# Patient Record
Sex: Female | Born: 1955 | Race: White | Hispanic: No | Marital: Single | Smoking: Never smoker
Health system: Southern US, Community
[De-identification: ages and names within clinical notes are randomized; demographics above are authoritative.]

---

## 2004-12-27 ENCOUNTER — Ambulatory Visit: Payer: Self-pay | Admitting: Family Medicine

## 2006-04-30 ENCOUNTER — Ambulatory Visit: Payer: Self-pay | Admitting: Family Medicine

## 2008-03-03 ENCOUNTER — Ambulatory Visit: Payer: Self-pay | Admitting: Family Medicine

## 2008-03-15 ENCOUNTER — Ambulatory Visit: Payer: Self-pay | Admitting: Family Medicine

## 2009-08-25 ENCOUNTER — Ambulatory Visit: Payer: Self-pay | Admitting: Family Medicine

## 2010-03-17 ENCOUNTER — Ambulatory Visit: Payer: Self-pay | Admitting: Gastroenterology

## 2010-06-03 ENCOUNTER — Ambulatory Visit: Payer: Self-pay | Admitting: Internal Medicine

## 2010-08-06 HISTORY — PX: BARIATRIC SURGERY: SHX1103

## 2010-08-29 ENCOUNTER — Ambulatory Visit: Payer: Self-pay | Admitting: Family Medicine

## 2010-10-27 ENCOUNTER — Ambulatory Visit: Payer: Self-pay | Admitting: Specialist

## 2010-11-08 ENCOUNTER — Ambulatory Visit: Payer: Self-pay | Admitting: Specialist

## 2010-12-05 ENCOUNTER — Ambulatory Visit: Payer: Self-pay | Admitting: Specialist

## 2011-09-09 ENCOUNTER — Ambulatory Visit: Payer: Self-pay | Admitting: Internal Medicine

## 2011-09-22 ENCOUNTER — Ambulatory Visit: Payer: Self-pay | Admitting: Family Medicine

## 2014-03-03 ENCOUNTER — Ambulatory Visit: Payer: Self-pay | Admitting: Family Medicine

## 2014-03-13 ENCOUNTER — Ambulatory Visit: Payer: Self-pay | Admitting: Physician Assistant

## 2014-03-13 LAB — URINALYSIS, COMPLETE
Bacteria: NEGATIVE
Blood: NEGATIVE
Glucose,UR: NEGATIVE mg/dL (ref 0–75)
Leukocyte Esterase: NEGATIVE
Nitrite: NEGATIVE
Ph: 6 (ref 4.5–8.0)
Specific Gravity: >=1.03 (ref 1.003–1.030)

## 2014-05-14 ENCOUNTER — Ambulatory Visit: Payer: Self-pay | Admitting: Specialist

## 2016-07-26 ENCOUNTER — Encounter: Payer: Self-pay | Admitting: Family Medicine

## 2016-07-26 ENCOUNTER — Encounter: Payer: BLUE CROSS/BLUE SHIELD | Admitting: Family Medicine

## 2016-07-26 NOTE — Progress Notes (Signed)
.  Erroneous encounter - pt not seen.

## 2016-07-26 NOTE — Patient Instructions (Signed)
Erroneous encounter

## 2016-08-01 ENCOUNTER — Ambulatory Visit (INDEPENDENT_AMBULATORY_CARE_PROVIDER_SITE_OTHER): Payer: BLUE CROSS/BLUE SHIELD | Admitting: Family Medicine

## 2016-08-01 ENCOUNTER — Encounter: Payer: Self-pay | Admitting: Family Medicine

## 2016-08-01 VITALS — BP 130/100 | HR 64 | Ht 64.0 in | Wt 171.0 lb

## 2016-08-01 DIAGNOSIS — R928 Other abnormal and inconclusive findings on diagnostic imaging of breast: Secondary | ICD-10-CM

## 2016-08-01 NOTE — Progress Notes (Signed)
Name: Rebecca Mosley   MRN: [id number]    DOB: 06/29/1956   Date:08/01/2016       Progress Note  Subjective  Chief Complaint  Chief Complaint  Patient presents with  . breast exam    needs mammo ordered    Patient presents for further evaluation of outside mammogram.  Density noted in left breast.  Awaiting mammogran images from [facility] facility.    No problem-specific Assessment & Plan notes found for this encounter.   History reviewed. No pertinent past medical history.  Past Surgical History:  Procedure Laterality Date  . BARIATRIC SURGERY  2012    Family History  Problem Relation Age of Onset  . Cancer Mother   . Diabetes Father   . Diabetes Sister     Social History   Social History  . Marital status: Single    Spouse name: N/A  . Number of children: N/A  . Years of education: N/A   Occupational History  . Not on file.   Social History Main Topics  . Smoking status: Never Smoker  . Smokeless tobacco: Never Used  . Alcohol use No  . Drug use: No  . Sexual activity: No   Other Topics Concern  . Not on file   Social History Narrative  . No narrative on file    Allergies  Allergen Reactions  . Ibuprofen      Review of Systems  Constitutional: Negative for chills, fever, malaise/fatigue and weight loss.  HENT: Negative for ear discharge, ear pain and sore throat.   Eyes: Negative for blurred vision.  Respiratory: Negative for cough, sputum production, shortness of breath and wheezing.   Cardiovascular: Negative for chest pain, palpitations and leg swelling.  Gastrointestinal: Negative for abdominal pain, blood in stool, constipation, diarrhea, heartburn, melena and nausea.  Genitourinary: Negative for dysuria, frequency, hematuria and urgency.  Musculoskeletal: Negative for back pain, joint pain, myalgias and neck pain.  Skin: Negative for rash.  Neurological: Negative for dizziness, tingling, sensory change, focal weakness and headaches.   Endo/Heme/Allergies: Negative for environmental allergies and polydipsia. Does not bruise/bleed easily.  Psychiatric/Behavioral: Negative for depression and suicidal ideas. The patient is not nervous/anxious and does not have insomnia.      Objective  Vitals:   08/01/16 0901  BP: (!) 130/100  Pulse: 64  Weight: 171 lb (77.6 kg)  Height: 5\' 4"  (1.626 m)    Physical Exam  Constitutional: She is well-developed, well-nourished, and in no distress. No distress.  HENT:  Head: Normocephalic and atraumatic.  Right Ear: External ear normal.  Left Ear: External ear normal.  Nose: Nose normal.  Mouth/Throat: Oropharynx is clear and moist.  Eyes: Conjunctivae and EOM are normal. Pupils are equal, round, and reactive to light. Right eye exhibits no discharge. Left eye exhibits no discharge.  Neck: Normal range of motion. Neck supple. No JVD present. No thyromegaly present.  Cardiovascular: Normal rate, regular rhythm, normal heart sounds and intact distal pulses.  Exam reveals no gallop and no friction rub.   No murmur heard. Pulmonary/Chest: Effort normal and breath sounds normal. She has no wheezes. She has no rales. Right breast exhibits no inverted nipple, no mass, no nipple discharge, no skin change and no tenderness. Left breast exhibits no inverted nipple, no mass, no nipple discharge, no skin change and no tenderness. Breasts are symmetrical.  Abdominal: Soft. Bowel sounds are normal. She exhibits no mass. There is no tenderness. There is no guarding.  Musculoskeletal: Normal range of  motion. She exhibits no edema.  Lymphadenopathy:    She has no cervical adenopathy.  Neurological: She is alert.  Skin: Skin is warm and dry. She is not diaphoretic.  Psychiatric: Mood and affect normal.  Nursing note and vitals reviewed.     Assessment & Plan  Problem List Items Addressed This Visit    None    Visit Diagnoses    Abnormal mammogram    -  Primary   conflicting information/  awaiting release of images to local radiology/ then will be able to make appt.   Relevant Orders   MM Digital Diagnostic Bilat     Pt only had a breast exam- no CPE   Dr. Elizabeth Sauereanna Jones [facility]  08/01/16

## 2016-08-02 ENCOUNTER — Other Ambulatory Visit: Payer: Self-pay | Admitting: *Deleted

## 2016-08-02 ENCOUNTER — Inpatient Hospital Stay
Admission: RE | Admit: 2016-08-02 | Discharge: 2016-08-02 | Disposition: A | Discharge status: Home / Self Care | Payer: Self-pay | Source: Ambulatory Visit | Attending: *Deleted | Admitting: *Deleted

## 2016-08-02 DIAGNOSIS — Z9289 Personal history of other medical treatment: Secondary | ICD-10-CM

## 2016-08-07 ENCOUNTER — Other Ambulatory Visit: Payer: Self-pay

## 2016-08-07 DIAGNOSIS — R928 Other abnormal and inconclusive findings on diagnostic imaging of breast: Secondary | ICD-10-CM

## 2016-08-21 ENCOUNTER — Ambulatory Visit
Admission: RE | Admit: 2016-08-21 | Discharge: 2016-08-21 | Disposition: A | Discharge status: Home / Self Care | Payer: BLUE CROSS/BLUE SHIELD | Source: Ambulatory Visit | Attending: Family Medicine | Admitting: Family Medicine

## 2016-08-21 ENCOUNTER — Other Ambulatory Visit: Payer: Self-pay | Admitting: Family Medicine

## 2016-08-21 DIAGNOSIS — R922 Inconclusive mammogram: Secondary | ICD-10-CM | POA: Insufficient documentation

## 2016-08-21 DIAGNOSIS — R928 Other abnormal and inconclusive findings on diagnostic imaging of breast: Secondary | ICD-10-CM

## 2017-02-05 ENCOUNTER — Other Ambulatory Visit: Payer: Self-pay | Admitting: Family Medicine

## 2017-02-05 ENCOUNTER — Other Ambulatory Visit: Payer: Self-pay

## 2017-02-05 ENCOUNTER — Telehealth: Payer: Self-pay

## 2017-02-05 DIAGNOSIS — R928 Other abnormal and inconclusive findings on diagnostic imaging of breast: Secondary | ICD-10-CM

## 2017-02-05 NOTE — Telephone Encounter (Signed)
Pt called stating wrong order for mammo was put in and needed to be changed per breast center. Sent in order for Diag L) breast mammo and possible L) breast [place]

## 2017-03-12 ENCOUNTER — Ambulatory Visit
Admission: RE | Admit: 2017-03-12 | Discharge: 2017-03-12 | Disposition: A | Discharge status: Home / Self Care | Payer: BLUE CROSS/BLUE SHIELD | Source: Ambulatory Visit | Attending: Family Medicine | Admitting: Family Medicine

## 2017-03-12 DIAGNOSIS — R928 Other abnormal and inconclusive findings on diagnostic imaging of breast: Secondary | ICD-10-CM

## 2017-04-25 ENCOUNTER — Encounter: Payer: Self-pay | Admitting: Family Medicine

## 2017-04-25 ENCOUNTER — Ambulatory Visit (INDEPENDENT_AMBULATORY_CARE_PROVIDER_SITE_OTHER): Payer: BLUE CROSS/BLUE SHIELD | Admitting: Family Medicine

## 2017-04-25 VITALS — BP 110/80 | HR 80 | Ht 64.0 in | Wt 176.0 lb

## 2017-04-25 DIAGNOSIS — Q447 Other congenital malformations of liver: Secondary | ICD-10-CM

## 2017-04-25 DIAGNOSIS — R748 Abnormal levels of other serum enzymes: Secondary | ICD-10-CM | POA: Diagnosis not present

## 2017-04-25 DIAGNOSIS — B351 Tinea unguium: Secondary | ICD-10-CM

## 2017-04-25 DIAGNOSIS — K7689 Other specified diseases of liver: Secondary | ICD-10-CM

## 2017-04-25 MED ORDER — CICLOPIROX 8 % EX SOLN: Freq: Every day | CUTANEOUS | 0 refills | Status: DC

## 2017-04-25 NOTE — Progress Notes (Signed)
Name: Rebecca Mosley   MRN: [id number]    DOB: 11-Jun-1956   Date:04/25/2017       Progress Note  Subjective  Chief Complaint  Chief Complaint  Patient presents with  . Nail Problem    R) middle fingernail- when had acrylic nails taken off- real nail came off. Green in appearance and nail doesn't "grow to the skin"    Rash  This is a new problem. The current episode started more than 1 month ago (8 weeks). The affected locations include the right fingers. The rash is characterized by scaling (thickened/green). Associated with: acryllic. Pertinent negatives include no cough, diarrhea, fatigue, fever, joint pain, shortness of breath or sore throat. Treatments tried: topical antifungal. The treatment provided no relief.    No problem-specific Assessment & Plan notes found for this encounter.   No past medical history on file.  Past Surgical History:  Procedure Laterality Date  . BARIATRIC SURGERY  2012    Family History  Problem Relation Age of Onset  . Cancer Mother   . Diabetes Father   . Diabetes Sister     Social History   Social History  . Marital status: Single    Spouse name: N/A  . Number of children: N/A  . Years of education: N/A   Occupational History  . Not on file.   Social History Main Topics  . Smoking status: Never Smoker  . Smokeless tobacco: Never Used  . Alcohol use No  . Drug use: No  . Sexual activity: No   Other Topics Concern  . Not on file   Social History Narrative  . No narrative on file    Allergies  Allergen Reactions  . Ibuprofen     Outpatient Medications Prior to Visit  Medication Sig Dispense Refill  . Multiple Vitamin (MULTIVITAMIN) capsule Take 1 capsule by mouth daily.    . Calcium Carb-Cholecalciferol (CALCIUM 1000 + D PO) Take 1 capsule by mouth daily.     No facility-administered medications prior to visit.     Review of Systems  Constitutional: Negative for chills, fatigue, fever, malaise/fatigue and weight  loss.  HENT: Negative for ear discharge, ear pain and sore throat.   Eyes: Negative for blurred vision.  Respiratory: Negative for cough, sputum production, shortness of breath and wheezing.   Cardiovascular: Negative for chest pain, palpitations and leg swelling.  Gastrointestinal: Negative for abdominal pain, blood in stool, constipation, diarrhea, heartburn, melena and nausea.  Genitourinary: Negative for dysuria, frequency, hematuria and urgency.  Musculoskeletal: Negative for back pain, joint pain, myalgias and neck pain.  Skin: Positive for rash.  Neurological: Negative for dizziness, tingling, sensory change, focal weakness and headaches.  Endo/Heme/Allergies: Negative for environmental allergies and polydipsia. Does not bruise/bleed easily.  Psychiatric/Behavioral: Negative for depression and suicidal ideas. The patient is not nervous/anxious and does not have insomnia.      Objective  Vitals:   04/25/17 1416  BP: 110/80  Pulse: 80  Weight: 176 lb (79.8 kg)  Height:  (1.626 m)    Physical Exam  Constitutional: She is well-developed, well-nourished, and in no distress. No distress.  HENT:  Head: Normocephalic and atraumatic.  Right Ear: External ear normal.  Left Ear: External ear normal.  Nose: Nose normal.  Mouth/Throat: Oropharynx is clear and moist.  Eyes: Pupils are equal, round, and reactive to light. Conjunctivae and EOM are normal. Right eye exhibits no discharge. Left eye exhibits no discharge.  Neck: Normal range of motion. Neck  supple. No JVD present. No thyromegaly present.  Cardiovascular: Normal rate, regular rhythm, normal heart sounds and intact distal pulses.  Exam reveals no gallop and no friction rub.   No murmur heard. Pulmonary/Chest: Effort normal and breath sounds normal. She has no wheezes. She has no rales.  Abdominal: Soft. Bowel sounds are normal. She exhibits no mass. There is no tenderness. There is no guarding.  Musculoskeletal: Normal  range of motion. She exhibits no edema.  Lymphadenopathy:    She has no cervical adenopathy.  Neurological: She is alert. She has normal reflexes.  Skin: Skin is warm and dry. She is not diaphoretic.  Psychiatric: Mood and affect normal.  Nursing note and vitals reviewed.     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 ciclopirox (PENLAC) 8 % solution    Sig: Apply topically at bedtime. Apply over nail and surrounding skin. Apply daily over previous coat. After seven (7) days, may remove with alcohol and continue cycle.    Dispense:  6.6 mL    Refill:  0      Dr. Elizabeth Sauer [facility]  04/25/17

## 2017-04-26 ENCOUNTER — Other Ambulatory Visit: Payer: Self-pay

## 2017-04-26 LAB — HEPATIC FUNCTION PANEL (6)
ALT: 16 IU/L (ref 0–32)
AST: 23 IU/L (ref 0–40)
Albumin: 4.2 g/dL (ref 3.6–4.8)
Alkaline Phosphatase: 70 IU/L (ref 39–117)
Bilirubin Total: 0.4 mg/dL (ref 0.0–1.2)
Bilirubin, Direct: 0.14 mg/dL (ref 0.00–0.40)

## 2017-06-19 ENCOUNTER — Other Ambulatory Visit: Payer: Self-pay | Admitting: Obstetrics and Gynecology

## 2017-06-19 ENCOUNTER — Other Ambulatory Visit: Payer: Self-pay | Admitting: Family Medicine

## 2017-06-19 DIAGNOSIS — Z1231 Encounter for screening mammogram for malignant neoplasm of breast: Secondary | ICD-10-CM

## 2017-07-25 ENCOUNTER — Ambulatory Visit
Admission: RE | Admit: 2017-07-25 | Discharge: 2017-07-25 | Disposition: A | Discharge status: Home / Self Care | Payer: BLUE CROSS/BLUE SHIELD | Source: Ambulatory Visit | Attending: Family Medicine | Admitting: Family Medicine

## 2017-07-25 DIAGNOSIS — Z1231 Encounter for screening mammogram for malignant neoplasm of breast: Secondary | ICD-10-CM | POA: Insufficient documentation

## 2017-07-31 ENCOUNTER — Ambulatory Visit: Payer: BLUE CROSS/BLUE SHIELD

## 2018-07-08 ENCOUNTER — Other Ambulatory Visit: Payer: Self-pay | Admitting: Family Medicine

## 2018-07-08 DIAGNOSIS — Z1231 Encounter for screening mammogram for malignant neoplasm of breast: Secondary | ICD-10-CM

## 2018-08-04 ENCOUNTER — Encounter (INDEPENDENT_AMBULATORY_CARE_PROVIDER_SITE_OTHER): Payer: Self-pay

## 2018-08-04 ENCOUNTER — Ambulatory Visit
Admission: RE | Admit: 2018-08-04 | Discharge: 2018-08-04 | Disposition: A | Discharge status: Home / Self Care | Payer: BLUE CROSS/BLUE SHIELD | Source: Ambulatory Visit | Attending: Family Medicine | Admitting: Family Medicine

## 2018-08-04 DIAGNOSIS — Z1231 Encounter for screening mammogram for malignant neoplasm of breast: Secondary | ICD-10-CM | POA: Diagnosis present

## 2018-08-27 ENCOUNTER — Encounter: Payer: Self-pay | Admitting: Family Medicine

## 2018-08-27 ENCOUNTER — Ambulatory Visit (INDEPENDENT_AMBULATORY_CARE_PROVIDER_SITE_OTHER): Payer: BLUE CROSS/BLUE SHIELD | Admitting: Family Medicine

## 2018-08-27 VITALS — BP 110/80 | HR 64 | Ht 64.0 in | Wt 178.0 lb

## 2018-08-27 DIAGNOSIS — M170 Bilateral primary osteoarthritis of knee: Secondary | ICD-10-CM | POA: Diagnosis not present

## 2018-08-27 DIAGNOSIS — Z23 Encounter for immunization: Secondary | ICD-10-CM

## 2018-08-27 DIAGNOSIS — I83813 Varicose veins of bilateral lower extremities with pain: Secondary | ICD-10-CM

## 2018-08-27 NOTE — Progress Notes (Signed)
Date:  08/27/2018   Name:  Rebecca Mosley   DOB:  09-16-1955   MRN:  [id number]   Chief Complaint: Leg Pain (bialteral leg pain, but worse in the left- magnesium otc has helped ) and tdap  Leg Pain   The incident occurred more than 1 week ago (more the an 6 months). The incident occurred at the gym. There was no injury mechanism. The pain is present in the right knee and left knee. The quality of the pain is described as aching. The pain is at a severity of 6/10. The pain is moderate. The pain has been improving since onset. Pertinent negatives include no inability to bear weight, loss of motion, loss of sensation, muscle weakness, numbness or tingling. The symptoms are aggravated by movement. She has tried acetaminophen for the symptoms. The treatment provided mild relief.  Knee Pain   The incident occurred more than 1 week ago. There was no injury mechanism. Pain location: posterior aspect L>R knee. The pain is moderate. The pain has been intermittent since onset. Pertinent negatives include no inability to bear weight, loss of motion, loss of sensation, muscle weakness, numbness or tingling.    Review of Systems  Constitutional: Negative.  Negative for chills, fatigue, fever and unexpected weight change.  HENT: Negative for congestion, ear discharge, ear pain, rhinorrhea, sinus pressure, sneezing and sore throat.   Eyes: Negative for photophobia, pain, discharge, redness and itching.  Respiratory: Negative for cough, shortness of breath, wheezing and stridor.   Gastrointestinal: Negative for abdominal pain, blood in stool, constipation, diarrhea, nausea and vomiting.  Endocrine: Negative for cold intolerance, heat intolerance, polydipsia, polyphagia and polyuria.  Genitourinary: Negative for dysuria, flank pain, frequency, hematuria, menstrual problem, pelvic pain, urgency, vaginal bleeding and vaginal discharge.  Musculoskeletal: Negative for arthralgias, back pain and myalgias.    Skin: Negative for rash.  Allergic/Immunologic: Negative for environmental allergies and food allergies.  Neurological: Negative for dizziness, tingling, weakness, light-headedness, numbness and headaches.  Hematological: Negative for adenopathy. Does not bruise/bleed easily.  Psychiatric/Behavioral: Negative for dysphoric mood. The patient is not nervous/anxious.     There are no active problems to display for this patient.   Allergies  Allergen Reactions  . Ibuprofen     Past Surgical History:  Procedure Laterality Date  . BARIATRIC SURGERY  2012    Social History   Tobacco Use  . Smoking status: Never Smoker  . Smokeless tobacco: Never Used  Substance Use Topics  . Alcohol use: No  . Drug use: No     Medication list has been reviewed and updated.  Current Meds  Medication Sig  . Magnesium 500 MG CAPS Take 1 capsule by mouth daily.  . Multiple Vitamin (MULTIVITAMIN) capsule Take 1 capsule by mouth daily.    PHQ 2/9 Scores 04/25/2017  PHQ - 2 Score 0  PHQ- 9 Score 0    Physical Exam Vitals signs and nursing note reviewed.  Constitutional:      General: She is not in acute distress.    Appearance: She is not diaphoretic.  HENT:     Head: Normocephalic and atraumatic.     Right Ear: External ear normal.     Left Ear: External ear normal.     Nose: Nose normal.  Eyes:     General:        Right eye: No discharge.        Left eye: No discharge.     Conjunctiva/sclera: Conjunctivae normal.  Pupils: Pupils are equal, round, and reactive to light.  Neck:     Musculoskeletal: Normal range of motion and neck supple.     Thyroid: No thyromegaly.     Vascular: No JVD.  Cardiovascular:     Rate and Rhythm: Normal rate and regular rhythm.     Pulses:          Popliteal pulses are 2+ on the right side and 2+ on the left side.       Dorsalis pedis pulses are 2+ on the right side and 2+ on the left side.       Posterior tibial pulses are 2+ on the right side  and 2+ on the left side.     Heart sounds: Normal heart sounds, S1 normal and S2 normal. No murmur. No systolic murmur. No diastolic murmur. No friction rub. No gallop. No S3 or S4 sounds.   Pulmonary:     Effort: Pulmonary effort is normal.     Breath sounds: Normal breath sounds.  Abdominal:     General: Bowel sounds are normal.     Palpations: Abdomen is soft. There is no mass.     Tenderness: There is no abdominal tenderness. There is no guarding.  Musculoskeletal: Normal range of motion.     Right lower leg: No edema.     Left lower leg: No edema.  Lymphadenopathy:     Cervical: No cervical adenopathy.  Skin:    General: Skin is warm and dry.  Neurological:     Mental Status: She is alert.     Deep Tendon Reflexes: Reflexes are normal and symmetric.     BP 110/80   Pulse 64   Ht 5\' 4"  (1.626 m)   Wt 178 lb (80.7 kg)   BMI 30.55 kg/m   Assessment and Plan: 1. Primary osteoarthritis of both knees Patient has bilateral knee pain left greater than the right particularly when she is walking on a treadmill that has an incline.  Pain is noted to be primarily in the posterior aspect of both knees.  This is consistent with primary osteoarthritis patient is unable to take NSAIDs and therefore will take Tylenol at this time.  2. Varicose veins of both lower extremities with pain Patient also has a history of varicose veins of the lower extremities and pain is associated in the and generalized area although these are not inflamed and would like to have them evaluated by vein and vascular for possible intervention. - Ambulatory referral to Vascular Surgery  3. Need for Tdap vaccination Discussed and administered. - Tdap vaccine greater than or equal to 7yo IM

## 2018-09-01 ENCOUNTER — Ambulatory Visit (INDEPENDENT_AMBULATORY_CARE_PROVIDER_SITE_OTHER): Payer: BLUE CROSS/BLUE SHIELD | Admitting: Vascular Surgery

## 2018-09-01 ENCOUNTER — Encounter (INDEPENDENT_AMBULATORY_CARE_PROVIDER_SITE_OTHER): Payer: Self-pay | Admitting: Vascular Surgery

## 2018-09-01 VITALS — BP 146/82 | HR 12 | Resp 12 | Ht 64.0 in | Wt 179.2 lb

## 2018-09-01 DIAGNOSIS — M79604 Pain in right leg: Secondary | ICD-10-CM

## 2018-09-01 DIAGNOSIS — M79605 Pain in left leg: Secondary | ICD-10-CM | POA: Diagnosis not present

## 2018-09-01 DIAGNOSIS — I83813 Varicose veins of bilateral lower extremities with pain: Secondary | ICD-10-CM | POA: Diagnosis not present

## 2018-09-01 DIAGNOSIS — R600 Localized edema: Secondary | ICD-10-CM

## 2018-09-01 NOTE — Progress Notes (Signed)
Subjective:    Patient ID: Rebecca Mosley, female    DOB: August 03, 1956, 63 y.o.   MRN: [id number] Chief Complaint  Patient presents with  . New Patient (Initial Visit)   Presents as a new patient referred by Dr. Yetta Barre for evaluation of bilateral lower extremity edema and discomfort.  The patient notes a longstanding history of varicosities located bilateral legs.  The patient notes that these varicosities especially to the left lower extremity behind her knee have recently become more painful.  The patient also endorses a history of "swelling" to the bilateral lower extremities.  The patient notes her swelling had only worsened during the summer however now she notes it is swelling all the time.  The swelling and discomfort worsen towards the end of the day with sitting and standing for long periods of time.  The patient does note a large cluster of varicosities located behind her left knee.  The patient notes that her swelling is improved in the morning.  The patient does get bilateral foot cramping with ambulation.  The patient denies any rest pain or ulcer formation to the bilateral legs.  Patient denies any recent surgery or trauma to the bilateral extremities.  Patient denies any recurrent or recent bouts of cellulitis.  Patient denies any fever, nausea vomiting.  Review of Systems  Constitutional: Negative.   HENT: Negative.   Eyes: Negative.   Respiratory: Negative.   Cardiovascular: Positive for leg swelling.       Painful varicose veins  Gastrointestinal: Negative.   Endocrine: Negative.   Genitourinary: Negative.   Musculoskeletal: Negative.   Skin: Negative.   Allergic/Immunologic: Negative.   Neurological: Negative.   Hematological: Negative.   Psychiatric/Behavioral: Negative.       Objective:   Physical Exam Vitals signs reviewed.  Constitutional:      Appearance: Normal appearance. She is normal weight.  HENT:     Head: Normocephalic and atraumatic.     Right Ear:  External ear normal.     Left Ear: External ear normal.     Nose: Nose normal.     Mouth/Throat:     Mouth: Mucous membranes are dry.     Pharynx: Oropharynx is clear.  Eyes:     Extraocular Movements: Extraocular movements intact.     Conjunctiva/sclera: Conjunctivae normal.     Pupils: Pupils are equal, round, and reactive to light.  Neck:     Musculoskeletal: Normal range of motion.  Cardiovascular:     Rate and Rhythm: Normal rate and regular rhythm.     Comments: Hard to palpate pedal pulses due to body habitus and edema however bilateral feet are warm. Pulmonary:     Effort: Pulmonary effort is normal.     Breath sounds: Normal breath sounds.  Musculoskeletal: Normal range of motion.        General: Swelling (Mild to moderate nonpitting edema noted bilaterally) present.  Skin:    General: Skin is warm and dry.     Comments: Scattered less than 1 cm varicose veins of the bilateral legs.  There is a large greater than 1 cm cluster of varicosities noted to the back of the left knee.  Neurological:     General: No focal deficit present.     Mental Status: She is alert and oriented to person, place, and time.  Psychiatric:        Mood and Affect: Mood normal.        Behavior: Behavior normal.  Thought Content: Thought content normal.        Judgment: Judgment normal.    BP (!) 146/82 (BP Location: Right Arm, Patient Position: Sitting)   Pulse (!) 12   Resp 12   Ht 5\' 4"  (1.626 m)   Wt 179 lb 3.2 oz (81.3 kg)   BMI 30.76 kg/m   No past medical history on file.  Social History   Socioeconomic History  . Marital status: Single    Spouse name: Not on file  . Number of children: Not on file  . Years of education: Not on file  . Highest education level: Not on file  Occupational History  . Not on file  Social Needs  . Financial resource strain: Not on file  . Food insecurity:    Worry: Not on file    Inability: Not on file  . Transportation needs:     Medical: Not on file    Non-medical: Not on file  Tobacco Use  . Smoking status: Never Smoker  . Smokeless tobacco: Never Used  Substance and Sexual Activity  . Alcohol use: No  . Drug use: No  . Sexual activity: Never  Lifestyle  . Physical activity:    Days per week: Not on file    Minutes per session: Not on file  . Stress: Not on file  Relationships  . Social connections:    Talks on phone: Not on file    Gets together: Not on file    Attends religious service: Not on file    Active member of club or organization: Not on file    Attends meetings of clubs or organizations: Not on file    Relationship status: Not on file  . Intimate partner violence:    Fear of current or ex partner: Not on file    Emotionally abused: Not on file    Physically abused: Not on file    Forced sexual activity: Not on file  Other Topics Concern  . Not on file  Social History Narrative  . Not on file   Past Surgical History:  Procedure Laterality Date  . BARIATRIC SURGERY  2012   Family History  Problem Relation Age of Onset  . Cancer Mother   . Diabetes Father   . Diabetes Sister   . Breast cancer Cousin        mat and pat cousins   Allergies  Allergen Reactions  . Ibuprofen       Assessment & Plan:  Presents as a new patient referred by Dr. Yetta BarreJones for evaluation of bilateral lower extremity edema and discomfort.  The patient notes a longstanding history of varicosities located bilateral legs.  The patient notes that these varicosities especially to the left lower extremity behind her knee have recently become more painful.  The patient also endorses a history of "swelling" to the bilateral lower extremities.  The patient notes her swelling had only worsened during the summer however now she notes it is swelling all the time.  The swelling and discomfort worsen towards the end of the day with sitting and standing for long periods of time.  The patient does note a large cluster of  varicosities located behind her left knee.  The patient notes that her swelling is improved in the morning.  The patient does get bilateral foot cramping with ambulation.  The patient denies any rest pain or ulcer formation to the bilateral legs.  Patient denies any recent surgery or trauma to the  bilateral extremities.  Patient denies any recurrent or recent bouts of cellulitis.  Patient denies any fever, nausea vomiting.  1. Bilateral lower extremity edema - New The patient was encouraged to wear graduated compression stockings (20-30 mmHg) on a daily basis. The patient was instructed to begin wearing the stockings first thing in the morning and removing them in the evening. The patient was instructed specifically not to sleep in the stockings. Prescription given. In addition, behavioral modification including elevation during the day will be initiated. The patient was advised to follow up in one months after wearing her compression stockings daily with elevation. Information on compression stockings was given to the patient. The patient was instructed to call the office in the interim if any worsening edema or ulcerations to the legs, feet or toes occurs. The patient expresses their understanding.  - VAS US LOWER EXTREMITY VENOUS REFLUX; Future  2. Varicose veins of both lower extremities with pain - New As above  - VAS US LOWER EXTREMITY VENOUS REFLUX; Future  3. Lower extremity pain, bilateral - New Patient experiences cramping to the bilateral feet with ambulation Hard to palpate pedal pulses on exam I will bring the patient back and have her undergo bilateral ABI to rule out any contributing peripheral artery disease I have discussed with the patient at length the risk factors for and pathogenesis of atherosclerotic disease and encouraged a healthy diet, regular exercise regimen and blood pressure / glucose control.  The patient was encouraged to call the office in the interim if he  experiences any claudication like symptoms, rest pain or ulcers to his feet / toes.  - VAS US ABI WITH/WO TBI; Future  Current Outpatient Medications on File Prior to Visit  Medication Sig Dispense Refill  . Cyanocobalamin (B-12) 2000 MCG TABS Take 1 tablet by mouth daily.    . Magnesium 500 MG CAPS Take 1 capsule by mouth daily.    . Multiple Vitamin (MULTIVITAMIN) capsule Take 1 capsule by mouth daily.     No current facility-administered medications on file prior to visit.    There are no Patient Instructions on file for this visit. No follow-ups on file.  Oliviana Mcgahee A Josua Ferrebee, PA-C

## 2018-10-02 ENCOUNTER — Ambulatory Visit (INDEPENDENT_AMBULATORY_CARE_PROVIDER_SITE_OTHER): Payer: BLUE CROSS/BLUE SHIELD

## 2018-10-02 ENCOUNTER — Encounter (INDEPENDENT_AMBULATORY_CARE_PROVIDER_SITE_OTHER): Payer: Self-pay | Admitting: Vascular Surgery

## 2018-10-02 ENCOUNTER — Ambulatory Visit (INDEPENDENT_AMBULATORY_CARE_PROVIDER_SITE_OTHER): Payer: BLUE CROSS/BLUE SHIELD | Admitting: Vascular Surgery

## 2018-10-02 ENCOUNTER — Encounter (INDEPENDENT_AMBULATORY_CARE_PROVIDER_SITE_OTHER): Payer: BLUE CROSS/BLUE SHIELD

## 2018-10-02 VITALS — BP 145/79 | HR 62 | Resp 16 | Ht 64.0 in | Wt 180.0 lb

## 2018-10-02 DIAGNOSIS — R600 Localized edema: Secondary | ICD-10-CM

## 2018-10-02 DIAGNOSIS — M79605 Pain in left leg: Secondary | ICD-10-CM

## 2018-10-02 DIAGNOSIS — I83813 Varicose veins of bilateral lower extremities with pain: Secondary | ICD-10-CM | POA: Diagnosis not present

## 2018-10-02 DIAGNOSIS — I890 Lymphedema, not elsewhere classified: Secondary | ICD-10-CM | POA: Diagnosis not present

## 2018-10-02 DIAGNOSIS — M79604 Pain in right leg: Secondary | ICD-10-CM

## 2018-10-02 NOTE — Progress Notes (Signed)
MRN : [id number]  Rebecca Mosley is a 63 y.o. (1955-08-16) female who presents with chief complaint of  Chief Complaint  Patient presents with  . Follow-up    5month abi  .  History of Present Illness:   The patient returns to the office for followup evaluation regarding leg swelling.  The swelling has persisted and the pain associated with swelling continues. There have not been any interval development of a ulcerations or wounds.  Since the previous visit the patient has been wearing graduated compression stockings and has noted little if any improvement in the lymphedema. The patient has been using compression routinely morning until night.  The patient also states elevation during the day and exercise is being done too.  Venous duplex shows trivial superficial reflux, deep system is compressible with minimal reflux as well.  ABI's are normal bilaterally   Current Meds  Medication Sig  . Cyanocobalamin (B-12) 2000 MCG TABS Take 1 tablet by mouth daily.  . Magnesium 500 MG CAPS Take 1 capsule by mouth daily.  . Multiple Vitamin (MULTIVITAMIN) capsule Take 1 capsule by mouth daily.    No past medical history on file.  Past Surgical History:  Procedure Laterality Date  . BARIATRIC SURGERY  2012    Social History Social History   Tobacco Use  . Smoking status: Never Smoker  . Smokeless tobacco: Never Used  Substance Use Topics  . Alcohol use: No  . Drug use: No    Family History Family History  Problem Relation Age of Onset  . Cancer Mother   . Diabetes Father   . Diabetes Sister   . Breast cancer Cousin        mat and pat cousins    Allergies  Allergen Reactions  . Ibuprofen      REVIEW OF SYSTEMS (Negative unless checked)  Constitutional: [] Weight loss  [] Fever  [] Chills Cardiac: [] Chest pain   [] Chest pressure   [] Palpitations   [] Shortness of breath when laying flat   [] Shortness of breath with exertion. Vascular:  [] Pain in legs with  walking   [x] Pain in legs with standing  [] History of DVT   [] Phlebitis   [x] Swelling in legs   [] Varicose veins   [] Non-healing ulcers Pulmonary:   [] Uses home oxygen   [] Productive cough   [] Hemoptysis   [] Wheeze  [] COPD   [] Asthma Neurologic:  [] Dizziness   [] Seizures   [] History of stroke   [] History of TIA  [] Aphasia   [] Vissual changes   [] Weakness or numbness in arm   [] Weakness or numbness in leg Musculoskeletal:   [] Joint swelling   [x] Joint pain   [] Low back pain Hematologic:  [] Easy bruising  [] Easy bleeding   [] Hypercoagulable state   [] Anemic Gastrointestinal:  [] Diarrhea   [] Vomiting  [] Gastroesophageal reflux/heartburn   [] Difficulty swallowing. Genitourinary:  [] Chronic kidney disease   [] Difficult urination  [] Frequent urination   [] Blood in urine Skin:  [] Rashes   [] Ulcers  Psychological:  [] History of anxiety   []  History of major depression.  Physical Examination  Vitals:   10/02/18 0830  BP: (!) 145/79  Pulse: 62  Resp: 16  Weight: 180 lb (81.6 kg)  Height: 5\' 4"  (1.626 m)   Body mass index is 30.9 kg/m. Gen: WD/WN, NAD Head: [place]/AT, No temporalis wasting.  Ear/Nose/Throat: Hearing grossly intact, nares w/o erythema or drainage Eyes: PER, EOMI, sclera nonicteric.  Neck: Supple, no large masses.   Pulmonary:  Good air movement, no audible wheezing bilaterally,  no use of accessory muscles.  Cardiac: RRR, no JVD Vascular:  3+ edema bilateral lower extremities Vessel Right Left  Radial Palpable Palpable  PT Trace Palpable Trace Palpable  DP Trace Palpable Trace Palpable  Gastrointestinal: Non-distended. No guarding/no peritoneal signs.  Musculoskeletal: M/S 5/5 throughout.  No deformity or atrophy.  Neurologic: CN 2-12 intact. Symmetrical.  Speech is fluent. Motor exam as listed above. Psychiatric: Judgment intact, Mood & affect appropriate for pt's clinical situation. Dermatologic: mild venous  rashes no ulcers noted.  No changes consistent with  cellulitis. Lymph : No lichenification or skin changes of chronic lymphedema.  CBC No results found for: WBC, HGB, HCT, MCV, PLT  BMET No results found for: NA, K, CL, CO2, GLUCOSE, BUN, CREATININE, CALCIUM, GFRNONAA, GFRAA CrCl cannot be calculated (No successful lab value found.).  COAG No results found for: INR, PROTIME  Radiology No results found.   Assessment/Plan 1. Varicose veins of both lower extremities with pain Recommend:  The patient is complaining of varicose veins.    I have had a long discussion with the patient regarding  varicose veins and why they cause symptoms.  Patient will begin wearing graduated compression stockings on a daily basis, beginning first thing in the morning and removing them in the evening. The patient is instructed specifically not to sleep in the stockings.    The patient  will also begin using over-the-counter analgesics such as Motrin 600 mg po TID to help control the symptoms as needed.    In addition, behavioral modification including elevation during the day will be initiated, utilizing a recliner was recommended.  The patient is also instructed to continue exercising such as walking 4-5 times per week.  At this time the patient wishes to continue conservative therapy and is not interested in more invasive treatments such as laser ablation and sclerotherapy.   2. Bilateral lower extremity edema Recommend:  No surgery or intervention at this point in time.    I have reviewed my previous discussion with the patient regarding swelling and why it causes symptoms.  Patient will continue wearing graduated compression stockings class 1 (20-30 mmHg) on a daily basis. The patient will  beginning wearing the stockings first thing in the morning and removing them in the evening. The patient is instructed specifically not to sleep in the stockings.    In addition, behavioral modification including several periods of elevation of the lower  extremities during the day will be continued.  This was reviewed with the patient during the initial visit.  The patient will also continue routine exercise, especially walking on a daily basis as was discussed during the initial visit.    Despite conservative treatments including graduated compression therapy class 1 and behavioral modification including exercise and elevation the patient  has not obtained adequate control of the lymphedema.  The patient still has stage 3 lymphedema and therefore, I believe that a lymph pump should be added to improve the control of the patient's lymphedema.  Additionally, a lymph pump is warranted because it will reduce the risk of cellulitis and ulceration in the future.  Patient should follow-up in six months    3. Lymphedema Recommend:  No surgery or intervention at this point in time.    I have reviewed my previous discussion with the patient regarding swelling and why it causes symptoms.  Patient will continue wearing graduated compression stockings class 1 (20-30 mmHg) on a daily basis. The patient will  beginning wearing the stockings first thing  in the morning and removing them in the evening. The patient is instructed specifically not to sleep in the stockings.    In addition, behavioral modification including several periods of elevation of the lower extremities during the day will be continued.  This was reviewed with the patient during the initial visit.  The patient will also continue routine exercise, especially walking on a daily basis as was discussed during the initial visit.    Despite conservative treatments including graduated compression therapy class 1 and behavioral modification including exercise and elevation the patient  has not obtained adequate control of the lymphedema.  The patient still has stage 3 lymphedema and therefore, I believe that a lymph pump should be added to improve the control of the patient's lymphedema.   Additionally, a lymph pump is warranted because it will reduce the risk of cellulitis and ulceration in the future.  Patient should follow-up in six months     Levora Dredge, MD  10/02/2018 9:02 AM

## 2018-10-05 ENCOUNTER — Encounter (INDEPENDENT_AMBULATORY_CARE_PROVIDER_SITE_OTHER): Payer: Self-pay | Admitting: Vascular Surgery

## 2018-10-05 DIAGNOSIS — I890 Lymphedema, not elsewhere classified: Secondary | ICD-10-CM | POA: Insufficient documentation

## 2018-12-11 ENCOUNTER — Ambulatory Visit (INDEPENDENT_AMBULATORY_CARE_PROVIDER_SITE_OTHER): Payer: BLUE CROSS/BLUE SHIELD | Admitting: Family Medicine

## 2018-12-11 ENCOUNTER — Other Ambulatory Visit: Payer: Self-pay

## 2018-12-11 ENCOUNTER — Encounter: Payer: Self-pay | Admitting: Family Medicine

## 2018-12-11 VITALS — BP 120/78 | HR 68 | Ht 64.0 in | Wt 176.0 lb

## 2018-12-11 DIAGNOSIS — M7542 Impingement syndrome of left shoulder: Secondary | ICD-10-CM | POA: Diagnosis not present

## 2018-12-11 DIAGNOSIS — K219 Gastro-esophageal reflux disease without esophagitis: Secondary | ICD-10-CM

## 2018-12-11 MED ORDER — FAMOTIDINE 40 MG PO TABS: 40.0000 mg | ORAL_TABLET | Freq: Every day | ORAL | 2 refills | Status: DC

## 2018-12-11 NOTE — Patient Instructions (Signed)

## 2018-12-11 NOTE — Progress Notes (Signed)
Date:  12/11/2018   Name:  Rebecca Mosley   DOB:  [birth year]   MRN:  [id number]   Chief Complaint: Shoulder Pain (bilateral shoulder pain- has been hurting x couple of weeks) and Gastroesophageal Reflux (after eating and laying down- hasn't tried anything otc)  Shoulder Pain   The pain is present in the left shoulder. This is a new problem. The current episode started 1 to 4 weeks ago. The problem occurs constantly. The problem has been unchanged. The quality of the pain is described as aching. The pain is at a severity of 4/10. The pain is moderate. Pertinent negatives include no fever, inability to bear weight, itching, joint locking, joint swelling, limited range of motion, numbness, stiffness or tingling. The symptoms are aggravated by activity. She has tried acetaminophen for the symptoms. The treatment provided mild (to start) relief.  Gastroesophageal Reflux  She complains of belching and heartburn. She reports no abdominal pain, no chest pain, no choking, no coughing, no dysphagia, no early satiety, no globus sensation, no hoarse voice, no nausea, no sore throat, no stridor, no tooth decay, no water brash or no wheezing. This is a chronic problem. The current episode started more than 1 month ago. The problem occurs occasionally. The problem has been gradually improving. The heartburn is located in the substernum. The heartburn is of moderate intensity. The symptoms are aggravated by certain foods. Pertinent negatives include no anemia, fatigue, melena, muscle weakness, orthopnea or weight loss. There are no known risk factors. She has tried nothing for the symptoms.    Review of Systems  Constitutional: Negative.  Negative for chills, fatigue, fever, unexpected weight change and weight loss.  HENT: Negative for congestion, ear discharge, ear pain, hoarse voice, rhinorrhea, sinus pressure, sneezing and sore throat.   Eyes: Negative for photophobia, pain, discharge, redness and itching.   Respiratory: Negative for cough, choking, shortness of breath, wheezing and stridor.   Cardiovascular: Negative for chest pain.  Gastrointestinal: Positive for heartburn. Negative for abdominal pain, blood in stool, constipation, diarrhea, dysphagia, melena, nausea and vomiting.  Endocrine: Negative for cold intolerance, heat intolerance, polydipsia, polyphagia and polyuria.  Genitourinary: Negative for dysuria, flank pain, frequency, hematuria, menstrual problem, pelvic pain, urgency, vaginal bleeding and vaginal discharge.  Musculoskeletal: Negative for arthralgias, back pain, myalgias, muscle weakness and stiffness.  Skin: Negative for itching and rash.  Allergic/Immunologic: Negative for environmental allergies and food allergies.  Neurological: Negative for dizziness, tingling, weakness, light-headedness, numbness and headaches.  Hematological: Negative for adenopathy. Does not bruise/bleed easily.  Psychiatric/Behavioral: Negative for dysphoric mood. The patient is not nervous/anxious.     Patient Active Problem List   Diagnosis Date Noted  . Lymphedema 10/05/2018  . Bilateral lower extremity edema 09/01/2018  . 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 Lower extremity pain, bilateral 09/01/2018    Allergies  Allergen Reactions  . Ibuprofen     Past Surgical History:  Procedure Laterality Date  . BARIATRIC SURGERY  2012    Social History   Tobacco Use  . Smoking status: Never Smoker  . Smokeless tobacco: Never Used  Substance Use Topics  . Alcohol use: No  . Drug use: No     Medication list has been reviewed and updated.  Current Meds  Medication Sig  . Cyanocobalamin (B-12) 2000 MCG TABS Take 1 tablet by mouth daily.  . Magnesium 500 MG CAPS Take 1 capsule by mouth daily.  . Multiple Vitamin (MULTIVITAMIN) capsule Take  1 capsule by mouth daily.    PHQ 2/9 Scores 12/11/2018 04/25/2017  PHQ - 2 Score 0 0  PHQ- 9 Score 0 0    BP  Readings from Last 3 Encounters:  12/11/18 120/78  10/02/18 (!) 145/79  09/01/18 (!) 146/82    Physical Exam Vitals signs and nursing note reviewed.  Constitutional:      General: She is not in acute distress.    Appearance: She is not diaphoretic.  HENT:     Head: Normocephalic and atraumatic.     Right Ear: Tympanic membrane, ear canal and external ear normal.     Left Ear: Tympanic membrane, ear canal and external ear normal.     Nose: Nose normal.  Eyes:     General:        Right eye: No discharge.        Left eye: No discharge.     Conjunctiva/sclera: Conjunctivae normal.     Pupils: Pupils are equal, round, and reactive to light.  Neck:     Musculoskeletal: Normal range of motion and neck supple.     Thyroid: No thyromegaly.     Vascular: No JVD.  Cardiovascular:     Rate and Rhythm: Normal rate and regular rhythm.     Pulses: Normal pulses.     Heart sounds: Normal heart sounds. No murmur. No friction rub. No gallop.   Pulmonary:     Effort: Pulmonary effort is normal.     Breath sounds: Normal breath sounds. No wheezing or rhonchi.  Abdominal:     General: Bowel sounds are normal.     Palpations: Abdomen is soft. There is no hepatomegaly, splenomegaly or mass.     Tenderness: There is no abdominal tenderness. There is no guarding or rebound.  Musculoskeletal: Normal range of motion.     Left shoulder: She exhibits tenderness, swelling and spasm. She exhibits normal range of motion, no bony tenderness, no effusion, no crepitus, no deformity and no laceration.     Comments: Spasm trapezius/ tender supraspinatis tendon  Lymphadenopathy:     Cervical: No cervical adenopathy.  Skin:    General: Skin is warm and dry.     Coloration: Skin is not jaundiced.     Findings: No erythema.  Neurological:     Mental Status: She is alert.     Deep Tendon Reflexes: Reflexes are normal and symmetric.     Wt Readings from Last 3 Encounters:  12/11/18 176 lb (79.8 kg)   10/02/18 180 lb (81.6 kg)  09/01/18 179 lb 3.2 oz (81.3 kg)    BP 120/78   Pulse 68   Ht 5\' 4"  (1.626 m)   Wt 176 lb (79.8 kg)   BMI 30.21 kg/m   Assessment and Plan: 1. Impingement syndrome of left shoulder New onset.  Persistent.  Patient unable to take an NSAID so we have encouraged her to take Tylenol will refer to orthopedics for possibility of injection. - Ambulatory referral to Orthopedic Surgery  2. Gastroesophageal reflux disease, esophagitis presence not specified Patient has been having increased episodes of reflux particularly when she lays down at night will initiate famotidine pain 40 mg daily with next step being a possibility of a PPI. - famotidine (PEPCID) 40 MG tablet; Take 1 tablet (40 mg total) by mouth daily.  Dispense: 30 tablet; Refill: 2

## 2018-12-12 ENCOUNTER — Other Ambulatory Visit: Payer: Self-pay

## 2018-12-12 DIAGNOSIS — K219 Gastro-esophageal reflux disease without esophagitis: Secondary | ICD-10-CM

## 2018-12-12 MED ORDER — FAMOTIDINE 40 MG PO TABS: 20.0000 mg | ORAL_TABLET | Freq: Every day | ORAL | 0 refills | Status: DC

## 2018-12-15 ENCOUNTER — Telehealth: Payer: Self-pay

## 2018-12-15 NOTE — Telephone Encounter (Signed)
Pt called in stating she hasn't heard anything from prtho. I sent a message to check on that as well as told the pt to call me at 3:00 this afternoon if she still hasn't heard. Also, she had to cut down to pepcid 20 mg due to headaches. The headaches are gone, but she is still having heartburn at night. I advised her to pick up either prilosec or Nexium otc. If this doesn't work, then we will send her to GI for further workup. Pt is concerned she "might be having a heart attack." In which case, I informed her that she needs to go to the ER for further workup

## 2018-12-19 DIAGNOSIS — K589 Irritable bowel syndrome without diarrhea: Secondary | ICD-10-CM | POA: Insufficient documentation

## 2018-12-19 DIAGNOSIS — K219 Gastro-esophageal reflux disease without esophagitis: Secondary | ICD-10-CM | POA: Insufficient documentation

## 2019-01-23 ENCOUNTER — Other Ambulatory Visit: Payer: Self-pay

## 2019-01-23 ENCOUNTER — Ambulatory Visit (INDEPENDENT_AMBULATORY_CARE_PROVIDER_SITE_OTHER): Payer: BC Managed Care – PPO | Admitting: Family Medicine

## 2019-01-23 ENCOUNTER — Encounter: Payer: Self-pay | Admitting: Family Medicine

## 2019-01-23 VITALS — BP 120/70 | HR 72 | Ht 64.0 in | Wt 170.0 lb

## 2019-01-23 DIAGNOSIS — H6121 Impacted cerumen, right ear: Secondary | ICD-10-CM | POA: Diagnosis not present

## 2019-01-23 DIAGNOSIS — K219 Gastro-esophageal reflux disease without esophagitis: Secondary | ICD-10-CM

## 2019-01-23 MED ORDER — CARBAMIDE PEROXIDE 6.5 % OT SOLN: 5.0000 [drp] | Freq: Two times a day (BID) | OTIC | 0 refills | Status: AC

## 2019-01-23 MED ORDER — PANTOPRAZOLE SODIUM 40 MG PO TBEC: 40.0000 mg | DELAYED_RELEASE_TABLET | Freq: Every day | ORAL | 6 refills | Status: DC

## 2019-01-23 NOTE — Progress Notes (Signed)
Date:  01/23/2019   Name:  Janice NorrieDorothy J Salinger   DOB:  05/18/1956   MRN:  [id number]   Chief Complaint: Follow-up (b/p was elevated in ER)  Hypertension This is a new problem. The problem has been waxing and waning since onset. The problem is controlled. Pertinent negatives include no anxiety, blurred vision, chest pain, headaches, malaise/fatigue, neck pain, orthopnea, palpitations, peripheral edema, PND, shortness of breath or sweats. Past treatments include lifestyle changes. The current treatment provides moderate improvement. There are no compliance problems.  There is no history of angina, kidney disease, CAD/MI, CVA, heart failure, left ventricular hypertrophy, PVD or retinopathy. There is no history of chronic renal disease, a hypertension causing med or renovascular disease.  Gastroesophageal Reflux She complains of heartburn. She reports no abdominal pain, no belching, no chest pain, no choking, no coughing, no dysphagia, no early satiety, no globus sensation, no hoarse voice, no nausea, no sore throat, no stridor, no tooth decay, no water brash or no wheezing. This is a recurrent problem. The problem has been waxing and waning. The symptoms are aggravated by certain foods. Pertinent negatives include no anemia, fatigue, muscle weakness, orthopnea or weight loss. She has tried a PPI (nexium) for the symptoms. The treatment provided moderate relief.    Review of Systems  Constitutional: Negative for chills, fatigue, fever, malaise/fatigue and weight loss.  HENT: Negative for drooling, ear discharge, ear pain, hoarse voice and sore throat.   Eyes: Negative for blurred vision.  Respiratory: Negative for cough, choking, shortness of breath and wheezing.   Cardiovascular: Negative for chest pain, palpitations, orthopnea, leg swelling and PND.  Gastrointestinal: Positive for heartburn. Negative for abdominal pain, blood in stool, constipation, diarrhea, dysphagia and nausea.  Endocrine:  Negative for polydipsia.  Genitourinary: Negative for dysuria, frequency, hematuria and urgency.  Musculoskeletal: Negative for back pain, myalgias, muscle weakness and neck pain.  Skin: Negative for rash.  Allergic/Immunologic: Negative for environmental allergies.  Neurological: Negative for dizziness and headaches.  Hematological: Does not bruise/bleed easily.  Psychiatric/Behavioral: Negative for suicidal ideas. The patient is not nervous/anxious.     Patient Active Problem List   Diagnosis Date Noted  . Lymphedema 10/05/2018  . Bilateral lower extremity edema 09/01/2018  . 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 Lower extremity pain, bilateral 09/01/2018    Allergies  Allergen Reactions  . Ibuprofen     Past Surgical History:  Procedure Laterality Date  . BARIATRIC SURGERY  2012    Social History   Tobacco Use  . Smoking status: Never Smoker  . Smokeless tobacco: Never Used  Substance Use Topics  . Alcohol use: No  . Drug use: No     Medication list has been reviewed and updated.  Current Meds  Medication Sig  . Cyanocobalamin (B-12) 2000 MCG TABS Take 1 tablet by mouth daily.  . famotidine (PEPCID) 40 MG tablet Take 0.5 tablets (20 mg total) by mouth daily.  . Magnesium 500 MG CAPS Take 1 capsule by mouth daily.  . Multiple Vitamin (MULTIVITAMIN) capsule Take 1 capsule by mouth daily.    PHQ 2/9 Scores 12/11/2018 04/25/2017  PHQ - 2 Score 0 0  PHQ- 9 Score 0 0    BP Readings from Last 3 Encounters:  01/23/19 120/70  12/11/18 120/78  10/02/18 (!) 145/79    Physical Exam Vitals signs and nursing note reviewed.  Constitutional:      Appearance: She is well-developed.  HENT:  Head: Normocephalic.     Right Ear: External ear normal. There is impacted cerumen.     Left Ear: Tympanic membrane, ear canal and external ear normal.     Nose: Nose normal. No congestion.     Mouth/Throat:     Mouth: Mucous membranes are moist.      Pharynx: Oropharynx is clear.  Eyes:     General: Lids are everted, no foreign bodies appreciated. No scleral icterus.       Left eye: No foreign body or hordeolum.     Conjunctiva/sclera: Conjunctivae normal.     Right eye: Right conjunctiva is not injected.     Left eye: Left conjunctiva is not injected.     Pupils: Pupils are equal, round, and reactive to light.  Neck:     Musculoskeletal: Normal range of motion and neck supple.     Thyroid: No thyromegaly.     Vascular: No JVD.     Trachea: No tracheal deviation.  Cardiovascular:     Rate and Rhythm: Normal rate and regular rhythm.     Chest Wall: PMI is not displaced. No thrill.     Pulses: Normal pulses.     Heart sounds: Normal heart sounds, S1 normal and S2 normal. No murmur. No systolic murmur. No diastolic murmur. No friction rub. No gallop. No S3 or S4 sounds.   Pulmonary:     Effort: Pulmonary effort is normal. No respiratory distress.     Breath sounds: Normal breath sounds. No wheezing or rales.  Abdominal:     General: Abdomen is flat. Bowel sounds are normal. There is no distension.     Palpations: Abdomen is soft. There is no mass.     Tenderness: There is no abdominal tenderness. There is no guarding or rebound.     Hernia: No hernia is present.  Musculoskeletal: Normal range of motion.        General: No tenderness.  Lymphadenopathy:     Cervical: No cervical adenopathy.  Skin:    General: Skin is warm.     Findings: No rash.  Neurological:     Mental Status: She is alert and oriented to person, place, and time.     Cranial Nerves: No cranial nerve deficit.     Deep Tendon Reflexes: Reflexes normal.  Psychiatric:        Mood and Affect: Mood is not anxious or depressed.     Wt Readings from Last 3 Encounters:  01/23/19 170 lb (77.1 kg)  12/11/18 176 lb (79.8 kg)  10/02/18 180 lb (81.6 kg)    BP 120/70   Pulse 72   Ht 5\' 4"  (1.626 m)   Wt 170 lb (77.1 kg)   BMI 29.18 kg/m   Assessment and  Plan: 1. Gastroesophageal reflux disease, esophagitis presence not specified Recurrent.  Controlled on PPI/Nexium.  Patient has discomfort in the epigastric area which not sufficiently controlled on Pepcid.  Patient is controlled on over-the-counter Nexium but we are going to investigate if pantoprazole by prescription 40 mg financially more viable as well as effective. - pantoprazole (PROTONIX) 40 MG tablet; Take 1 tablet (40 mg total) by mouth daily.  Dispense: 30 tablet; Refill: 6  2. Impacted cerumen of right ear Right temple panic membrane is occluded by cerumen that is impacted in the canal.  We will initiate Debrox followed with use of a bulb syringe for removal by patient.  Patient may return to clinic if seemingly still unresolved. - carbamide peroxide (  DEBROX) 6.5 % OTIC solution; Place 5 drops into the right ear 2 (two) times daily.  Dispense: 15 mL; Refill: 0

## 2019-04-02 ENCOUNTER — Other Ambulatory Visit: Payer: Self-pay

## 2019-04-02 ENCOUNTER — Encounter (INDEPENDENT_AMBULATORY_CARE_PROVIDER_SITE_OTHER): Payer: Self-pay

## 2019-04-02 ENCOUNTER — Encounter (INDEPENDENT_AMBULATORY_CARE_PROVIDER_SITE_OTHER): Payer: Self-pay | Admitting: Vascular Surgery

## 2019-04-02 ENCOUNTER — Ambulatory Visit (INDEPENDENT_AMBULATORY_CARE_PROVIDER_SITE_OTHER): Payer: BLUE CROSS/BLUE SHIELD | Admitting: Vascular Surgery

## 2019-04-02 VITALS — BP 121/79 | HR 61 | Resp 16 | Ht 64.0 in | Wt 166.0 lb

## 2019-04-02 DIAGNOSIS — I890 Lymphedema, not elsewhere classified: Secondary | ICD-10-CM

## 2019-04-02 DIAGNOSIS — K219 Gastro-esophageal reflux disease without esophagitis: Secondary | ICD-10-CM

## 2019-04-02 DIAGNOSIS — I872 Venous insufficiency (chronic) (peripheral): Secondary | ICD-10-CM

## 2019-04-02 DIAGNOSIS — R1011 Right upper quadrant pain: Secondary | ICD-10-CM | POA: Insufficient documentation

## 2019-04-02 NOTE — Progress Notes (Signed)
MRN : [id number]  Rebecca Mosley is a 63 y.o. (June 02, 1956) female who presents with chief complaint of  Chief Complaint  Patient presents with  . Follow-up    15month follow up  .  History of Present Illness:   The patient returns to the office for followup evaluation regarding leg swelling.  The swelling has persisted and the pain associated with swelling continues. There have not been any interval development of a ulcerations or wounds.  Since the previous visit the patient has been wearing graduated compression stockings and has noted little if any improvement in the lymphedema. The patient has been using compression routinely morning until night.  The patient also states elevation during the day and exercise is being done too.  Venous duplex shows trivial superficial reflux, deep system is compressible with minimal reflux as well.  ABI's are normal bilaterally  Current Meds  Medication Sig  . Cyanocobalamin (B-12) 2000 MCG TABS Take 1 tablet by mouth daily.  . Magnesium 500 MG CAPS Take 1 capsule by mouth daily.  . Multiple Vitamin (MULTIVITAMIN) capsule Take 1 capsule by mouth daily.    No past medical history on file.  Past Surgical History:  Procedure Laterality Date  . BARIATRIC SURGERY  2012    Social History Social History   Tobacco Use  . Smoking status: Never Smoker  . Smokeless tobacco: Never Used  Substance Use Topics  . Alcohol use: No  . Drug use: No    Family History Family History  Problem Relation Age of Onset  . Cancer Mother   . Diabetes Father   . Diabetes Sister   . Breast cancer Cousin        mat and pat cousins    Allergies  Allergen Reactions  . Ibuprofen      REVIEW OF SYSTEMS (Negative unless checked)  Constitutional: [] Weight loss  [] Fever  [] Chills Cardiac: [] Chest pain   [] Chest pressure   [] Palpitations   [] Shortness of breath when laying flat   [] Shortness of breath with exertion. Vascular:  [] Pain in legs  with walking   [] Pain in legs at rest  [] History of DVT   [] Phlebitis   [x] Swelling in legs   [] Varicose veins   [] Non-healing ulcers Pulmonary:   [] Uses home oxygen   [] Productive cough   [] Hemoptysis   [] Wheeze  [] COPD   [] Asthma Neurologic:  [] Dizziness   [] Seizures   [] History of stroke   [] History of TIA  [] Aphasia   [] Vissual changes   [] Weakness or numbness in arm   [] Weakness or numbness in leg Musculoskeletal:   [] Joint swelling   [] Joint pain   [] Low back pain Hematologic:  [] Easy bruising  [] Easy bleeding   [] Hypercoagulable state   [] Anemic Gastrointestinal:  [] Diarrhea   [] Vomiting  [x] Gastroesophageal reflux/heartburn   [] Difficulty swallowing. Genitourinary:  [] Chronic kidney disease   [] Difficult urination  [] Frequent urination   [] Blood in urine Skin:  [] Rashes   [] Ulcers  Psychological:  [] History of anxiety   []  History of major depression.  Physical Examination  Vitals:   04/02/19 0841  BP: 121/79  Pulse: 61  Resp: 16  Weight: 166 lb (75.3 kg)  Height: 5\' 4"  (1.626 m)   Body mass index is 28.49 kg/m. Gen: WD/WN, NAD Head: [place]/AT, No temporalis wasting.  Ear/Nose/Throat: Hearing grossly intact, nares w/o erythema or drainage Eyes: PER, EOMI, sclera nonicteric.  Neck: Supple, no large masses.   Pulmonary:  Good air movement, no audible wheezing bilaterally, no  use of accessory muscles.  Cardiac: RRR, no JVD Vascular: scattered varicosities present bilaterally.  Mild venous stasis changes to the legs bilaterally.  2+ soft pitting edema Vessel Right Left  Radial Palpable Palpable  PT Palpable Palpable  DP Palpable Palpable  Gastrointestinal: Non-distended. No guarding/no peritoneal signs.  Musculoskeletal: M/S 5/5 throughout.  No deformity or atrophy.  Neurologic: CN 2-12 intact. Symmetrical.  Speech is fluent. Motor exam as listed above. Psychiatric: Judgment intact, Mood & affect appropriate for pt's clinical situation. Dermatologic: mild venous rashes no ulcers  noted.  No changes consistent with cellulitis. Lymph : No lichenification or skin changes of chronic lymphedema.  CBC No results found for: WBC, HGB, HCT, MCV, PLT  BMET No results found for: NA, K, CL, CO2, GLUCOSE, BUN, CREATININE, CALCIUM, GFRNONAA, GFRAA CrCl cannot be calculated (No successful lab value found.).  COAG No results found for: INR, PROTIME  Radiology No results found.   Assessment/Plan 1. Lymphedema Recommend:  No surgery or intervention at this point in time.    I have reviewed my previous discussion with the patient regarding swelling and why it causes symptoms.  Patient will continue wearing graduated compression stockings class 1 (20-30 mmHg) on a daily basis. The patient will  beginning wearing the stockings first thing in the morning and removing them in the evening. The patient is instructed specifically not to sleep in the stockings.    In addition, behavioral modification including several periods of elevation of the lower extremities during the day will be continued.  This was reviewed with the patient during the initial visit.  The patient will also continue routine exercise, especially walking on a daily basis as was discussed during the initial visit.    Despite conservative treatments including graduated compression therapy class 1 and behavioral modification including exercise and elevation the patient  has not obtained adequate control of the lymphedema.  The patient still has stage 3 lymphedema and therefore, I believe that a lymph pump should be added to improve the control of the patient's lymphedema.  Additionally, a lymph pump is warranted because it will reduce the risk of cellulitis and ulceration in the future.  Patient should follow-up in 12 months   2. Chronic venous insufficiency Recommend:  No surgery or intervention at this point in time.    I have reviewed my previous discussion with the patient regarding swelling and why  it causes symptoms.  Patient will continue wearing graduated compression stockings class 1 (20-30 mmHg) on a daily basis. The patient will  beginning wearing the stockings first thing in the morning and removing them in the evening. The patient is instructed specifically not to sleep in the stockings.    In addition, behavioral modification including several periods of elevation of the lower extremities during the day will be continued.  This was reviewed with the patient during the initial visit.  The patient will also continue routine exercise, especially walking on a daily basis as was discussed during the initial visit  3. Gastroesophageal reflux disease without esophagitis Continue PPI as already ordered, this medication has been reviewed and there are no changes at this time.  Avoidence of caffeine and alcohol  Moderate elevation of the head of the bed     Levora DredgeGregory Schnier, MD  04/02/2019 8:53 AM

## 2019-04-04 ENCOUNTER — Encounter (INDEPENDENT_AMBULATORY_CARE_PROVIDER_SITE_OTHER): Payer: Self-pay | Admitting: Vascular Surgery

## 2019-04-04 DIAGNOSIS — I872 Venous insufficiency (chronic) (peripheral): Secondary | ICD-10-CM | POA: Insufficient documentation

## 2019-04-21 ENCOUNTER — Encounter: Payer: Self-pay | Admitting: Emergency Medicine

## 2019-04-21 ENCOUNTER — Other Ambulatory Visit: Payer: Self-pay

## 2019-04-21 ENCOUNTER — Ambulatory Visit
Admission: EM | Admit: 2019-04-21 | Discharge: 2019-04-21 | Disposition: A | Discharge status: Home / Self Care | Payer: BC Managed Care – PPO | Attending: Urgent Care | Admitting: Urgent Care

## 2019-04-21 DIAGNOSIS — R21 Rash and other nonspecific skin eruption: Secondary | ICD-10-CM | POA: Diagnosis not present

## 2019-04-21 MED ORDER — PREDNISONE 10 MG (21) PO TBPK: ORAL_TABLET | Freq: Every day | ORAL | 0 refills | Status: DC

## 2019-04-21 NOTE — ED Triage Notes (Signed)
Patient c/o itchy rash all over body that started a few weeks ago. Patient has not tried any OTC medications.

## 2019-04-21 NOTE — ED Provider Notes (Signed)
[place], [place]   Name: Rebecca NorrieDorothy J Bogacki DOB: 12/14/1955 MRN: [id number] CSN: [id number] PCP: Duanne LimerickJones, Deanna C, MD  Arrival date and time:  04/21/19 1704  Chief Complaint:  Rash   NOTE: Prior to seeing the patient today, I have reviewed the triage nursing documentation and vital signs. Clinical staff has updated patient's PMH/PSHx, current medication list, and drug allergies/intolerances to ensure comprehensive history available to assist in medical decision making.   History:   HPI: Rebecca Mosley is a 63 y.o. female who presents today with complaints of rash to her entire body that initially declared a few weeks ago, but has progressed over the course of the last 2 days. Patient states, "it has been a bad season for me and bugs". Rash started on the dorsal aspects of her feet and has progressively spread to her entire body.  Patient denies any new medications, foods, soaps/body washes, laundry detergents, or cosmetics. She denies a past medical history significant for environmental allergies. She has been outside walking a lot. Patient reports that she moved the grass on Sunday, which is when she noticed an acute exacerbation of her symptoms. She advises that she has never developed a skin eruption like this in the past. Rash is erythematous and pruritic. She has not appreciated any drainage associated with the rash. There is no facial involvement; no rash to periorbital, paranasal, or perioral areas. Patient denies that she is not experiencing any shortness of breath or sensation of pharyngeal/laryngeal fullness. Despite her symptoms, patient has not taken any over the counter interventions to help improve/relieve her reported symptoms at home.   History reviewed. No pertinent past medical history.  Past Surgical History:  Procedure Laterality Date  . BARIATRIC SURGERY  2012    Family History  Problem Relation Age of Onset  . Cancer Mother   . Diabetes Father   . Diabetes Sister   .  Breast cancer Cousin        mat and pat cousins    Social History   Tobacco Use  . Smoking status: Never Smoker  . Smokeless tobacco: Never Used  Substance Use Topics  . Alcohol use: No  . Drug use: No    Patient Active Problem List   Diagnosis Date Noted  . Chronic venous insufficiency 04/04/2019  . Right upper quadrant pain 04/02/2019  . Gastroesophageal reflux disease 12/19/2018  . Irritable bowel syndrome 12/19/2018  . Lymphedema 10/05/2018  . Bilateral lower extremity edema 09/01/2018  . 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 Lower extremity pain, bilateral 09/01/2018    Home Medications:    Current Meds  Medication Sig  . carbamide peroxide (DEBROX) 6.5 % OTIC solution Place 5 drops into the right ear 2 (two) times daily.  . Cyanocobalamin (B-12) 2000 MCG TABS Take 1 tablet by mouth daily.  . Magnesium 500 MG CAPS Take 1 capsule by mouth daily.  . Multiple Vitamin (MULTIVITAMIN) capsule Take 1 capsule by mouth daily.    Allergies:   Ibuprofen  Review of Systems (ROS): Review of Systems  Constitutional: Negative for chills and fever.  HENT: Negative for drooling, facial swelling and trouble swallowing.   Respiratory: Negative for cough, chest tightness, shortness of breath and wheezing.   Cardiovascular: Negative for chest pain and palpitations.  Skin: Positive for rash.  All other systems reviewed and are negative.    Vital Signs: Today's Vitals   04/21/19 1711 04/21/19 1712 04/21/19 1713 04/21/19 1729  BP:  139/87   Pulse:   69   Resp:   18   Temp:   98.2 F (36.8 C)   TempSrc:   Oral   SpO2:   100%   Weight:  163 lb (73.9 kg)    Height:  5\' 4"  (1.626 m)    PainSc: 0-No pain   0-No pain    Physical Exam: Physical Exam  Constitutional: She is oriented to person, place, and time and well-developed, well-nourished, and in no distress.  HENT:  Head: Normocephalic and atraumatic.  Mouth/Throat: Mucous membranes are  normal.  Eyes: Pupils are equal, round, and reactive to light. EOM are normal.  Neck: Normal range of motion. Neck supple. No tracheal deviation present.  Cardiovascular: Normal rate, regular rhythm, normal heart sounds and intact distal pulses. Exam reveals no gallop and no friction rub.  Pulmonary/Chest: Effort normal and breath sounds normal. No respiratory distress. She has no wheezes. She has no rales.  Neurological: She is alert and oriented to person, place, and time. Gait normal.  Skin: Skin is warm and dry. Rash noted. Rash is maculopapular (BUE/BLE, back, stomach, and buttocks; erythematous and pruritic ).  Psychiatric: Mood, memory, affect and judgment normal.  Nursing note and vitals reviewed.   Urgent Care Treatments / Results:   LABS: PLEASE NOTE: all labs that were ordered this encounter are listed, however only abnormal results are displayed. Labs Reviewed - No data to display  EKG: -None  RADIOLOGY: No results found.  PROCEDURES: Procedures  MEDICATIONS RECEIVED THIS VISIT: Medications - No data to display  PERTINENT CLINICAL COURSE NOTES/UPDATES:   Initial Impression / Assessment and Plan / Urgent Care Course:  Pertinent labs & imaging results that were available during my care of the patient were personally reviewed by me and considered in my medical decision making (see lab/imaging section of note for values and interpretations).  Rebecca Mosley is a 63 y.o. female who presents to [facility] today with complaints of Rash   Patient is well appearing overall in clinic today. She does not appear to be in any acute distress. Presenting symptoms (see HPI) and exam as documented above. Rash is diffusely distributed to virtually her entire body. Discussed etiology as likely being chiggers based on her reported history of outdoor activities when the symptom exacerbation occurred. Patient has not taken any antihistamine medications to help with her symptoms.  Encouraged patient to start oral diphenhydramine per package instructions to help with her itching. Discussed topical versus oral steroids. Due to the large surface area that patient will have to cover, coupled with her degree of symptoms, I feel that a systemic course of steroids would be of more benefits to her. Rx for taper sent in. Patient to return call to the clinic if not improving.   Discussed follow up with primary care physician in 1 week for re-evaluation. I have reviewed the follow up and strict return precautions for any new or worsening symptoms. Patient is aware of symptoms that would be deemed urgent/emergent, and would thus require further evaluation either here or in the emergency department. At the time of discharge, she verbalized understanding and consent with the discharge plan as it was reviewed with her. All questions were fielded by provider and/or clinic staff prior to patient discharge.    Final Clinical Impressions / Urgent Care Diagnoses:   Final diagnoses:  Rash    New Prescriptions:  [place] Controlled Substance Registry consulted? Not Applicable  Meds ordered this encounter  Medications  . predniSONE (STERAPRED UNI-PAK 21 TAB) 10 MG (21) TBPK tablet    Sig: Take by mouth daily. 60 mg x 1 day, 50 mg x 1 day, 40 mg x 1 day, 30 mg x 1 day, 20 mg x 1 day, 10 mg x 1 day    Dispense:  21 tablet    Refill:  0    Recommended Follow up Care:  Patient encouraged to follow up with the following provider within the specified time frame, or sooner as dictated by the severity of her symptoms. As always, she was instructed that for any urgent/emergent care needs, she should seek care either here or in the emergency department for more immediate evaluation.  Follow-up Information    Juline Patch, MD In 1 week.   Specialty: Family Medicine Why: General reassessment of symptoms if not improving Contact information: [address] [place] [address] [phone]          NOTE: This note was prepared using Dragon dictation software along with smaller phrase technology. Despite my best ability to proofread, there is the potential that transcriptional errors may still occur from this process, and are completely unintentional.    Karen Kitchens, NP 04/21/19 1737

## 2019-04-21 NOTE — Discharge Instructions (Signed)
It was very nice seeing you today in clinic. Thank you for entrusting me with your care.  ° °Please utilize the medications that we discussed. Your prescriptions have been called in to your pharmacy.  ° °Make arrangements to follow up with your regular doctor in 1 week for re-evaluation if not improving. If your symptoms/condition worsens, please seek follow up care either here or in the ER. Please remember, our [place] providers are "right here with you" when you need us.  ° °Again, it was my pleasure to take care of you today. Thank you for choosing our clinic. I hope that you start to feel better quickly.  ° °Emalia Witkop, MSN, APRN, FNP-C, CEN °Advanced Practice Provider °[facility] [place] [facility] ° °

## 2019-06-12 ENCOUNTER — Encounter: Payer: Self-pay | Admitting: Family Medicine

## 2019-06-12 ENCOUNTER — Other Ambulatory Visit: Payer: Self-pay

## 2019-06-12 ENCOUNTER — Ambulatory Visit (INDEPENDENT_AMBULATORY_CARE_PROVIDER_SITE_OTHER): Payer: BC Managed Care – PPO | Admitting: Family Medicine

## 2019-06-12 VITALS — BP 120/70 | HR 72 | Ht 64.0 in | Wt 167.0 lb

## 2019-06-12 DIAGNOSIS — M7052 Other bursitis of knee, left knee: Secondary | ICD-10-CM | POA: Diagnosis not present

## 2019-06-12 DIAGNOSIS — M1712 Unilateral primary osteoarthritis, left knee: Secondary | ICD-10-CM

## 2019-06-12 MED ORDER — PREDNISONE 10 MG PO TABS: 10.0000 mg | ORAL_TABLET | Freq: Every day | ORAL | 0 refills | Status: DC

## 2019-06-12 NOTE — Progress Notes (Signed)
Date:  06/12/2019   Name:  Rebecca Mosley   DOB:  1956/02/04   MRN:  [id number]   Chief Complaint: Knee Pain (L) knee pain- knows she has arthritis, but going up and down with wood has aggravated it)  Knee Pain  The incident occurred 2 days ago. The incident occurred at home. Injury mechanism: loading wood. The pain is present in the left knee. The pain is at a severity of 4/10. The pain is moderate. The pain has been intermittent since onset. Pertinent negatives include no inability to bear weight or loss of motion. The symptoms are aggravated by movement. She has tried acetaminophen for the symptoms. The treatment provided mild relief.    Review of Systems  Constitutional: Negative for chills and fever.  HENT: Negative for drooling, ear discharge, ear pain and sore throat.   Respiratory: Negative for cough, shortness of breath and wheezing.   Cardiovascular: Negative for chest pain, palpitations and leg swelling.  Gastrointestinal: Negative for abdominal pain, blood in stool, constipation, diarrhea and nausea.  Endocrine: Negative for polydipsia.  Genitourinary: Negative for dysuria, frequency, hematuria and urgency.  Musculoskeletal: Negative for back pain, myalgias and neck pain.  Skin: Negative for rash.  Allergic/Immunologic: Negative for environmental allergies.  Neurological: Negative for dizziness and headaches.  Hematological: Does not bruise/bleed easily.  Psychiatric/Behavioral: Negative for suicidal ideas. The patient is not nervous/anxious.     Patient Active Problem List   Diagnosis Date Noted  . Chronic venous insufficiency 04/04/2019  . Right upper quadrant pain 04/02/2019  . Gastroesophageal reflux disease 12/19/2018  . Irritable bowel syndrome 12/19/2018  . Lymphedema 10/05/2018  . Bilateral lower extremity edema 09/01/2018  . 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 Lower extremity pain, bilateral 09/01/2018    Allergies   Allergen Reactions  . Ibuprofen     Past Surgical History:  Procedure Laterality Date  . BARIATRIC SURGERY  2012    Social History   Tobacco Use  . Smoking status: Never Smoker  . Smokeless tobacco: Never Used  Substance Use Topics  . Alcohol use: No  . Drug use: No     Medication list has been reviewed and updated.  Current Meds  Medication Sig  . carbamide peroxide (DEBROX) 6.5 % OTIC solution Place 5 drops into the right ear 2 (two) times daily.  . Magnesium 500 MG CAPS Take 1 capsule by mouth daily.  . Multiple Vitamin (MULTIVITAMIN) capsule Take 1 capsule by mouth daily.  . Multiple Vitamins-Minerals (PRESERVISION AREDS 2 PO) Take 1 tablet by mouth daily.  . [DISCONTINUED] Cyanocobalamin (B-12) 2000 MCG TABS Take 1 tablet by mouth daily.    PHQ 2/9 Scores 12/11/2018 04/25/2017  PHQ - 2 Score 0 0  PHQ- 9 Score 0 0    BP Readings from Last 3 Encounters:  06/12/19 120/70  04/21/19 139/87  04/02/19 121/79    Physical Exam Vitals signs and nursing note reviewed.  Constitutional:      Appearance: She is well-developed.  HENT:     Head: Normocephalic.     Right Ear: Tympanic membrane, ear canal and external ear normal.     Left Ear: Tympanic membrane, ear canal and external ear normal.     Nose: Nose normal.  Eyes:     General: Lids are everted, no foreign bodies appreciated. No scleral icterus.       Left eye: No foreign body or hordeolum.     Conjunctiva/sclera: Conjunctivae normal.  Right eye: Right conjunctiva is not injected.     Left eye: Left conjunctiva is not injected.     Pupils: Pupils are equal, round, and reactive to light.  Neck:     Musculoskeletal: Normal range of motion and neck supple.     Thyroid: No thyromegaly.     Vascular: No JVD.     Trachea: No tracheal deviation.  Cardiovascular:     Rate and Rhythm: Normal rate and regular rhythm.     Heart sounds: Normal heart sounds. No murmur. No friction rub. No gallop.   Pulmonary:      Effort: Pulmonary effort is normal. No respiratory distress.     Breath sounds: Normal breath sounds. No wheezing, rhonchi or rales.  Abdominal:     General: Bowel sounds are normal.     Palpations: Abdomen is soft. There is no mass.     Tenderness: There is no abdominal tenderness. There is no guarding or rebound.  Musculoskeletal: Normal range of motion.     Left knee: She exhibits swelling and abnormal patellar mobility. She exhibits normal range of motion and no effusion. Tenderness found. Medial joint line tenderness noted. No lateral joint line, no MCL, no LCL and no patellar tendon tenderness noted.     Comments: Patellar crepitence/ popliteal tenderness  Lymphadenopathy:     Cervical: No cervical adenopathy.  Skin:    General: Skin is warm.     Findings: No rash.  Neurological:     Mental Status: She is alert and oriented to person, place, and time.     Cranial Nerves: No cranial nerve deficit.     Deep Tendon Reflexes: Reflexes normal.  Psychiatric:        Mood and Affect: Mood is not anxious or depressed.     Wt Readings from Last 3 Encounters:  06/12/19 167 lb (75.8 kg)  04/21/19 163 lb (73.9 kg)  04/02/19 166 lb (75.3 kg)    BP 120/70   Pulse 72   Ht 5\' 4"  (1.626 m)   Wt 167 lb (75.8 kg)   BMI 28.67 kg/m   Assessment and Plan: 1. Popliteal bursitis of left knee Acute flare.  Persistent.  Chronic problem.  Patient is unable to take any NSAIDs due to urticaria.  Will initiate prednisone 10 mg 20 mg a day for 4 days followed by 10 mg for 4 days patient has been encouraged to ice the knee 2-3 times a day on the anterior surface and to decrease activity tomorrow specifically limiting activity on food distribution .- predniSONE (DELTASONE) 10 MG tablet; Take 1 tablet (10 mg total) by mouth daily with breakfast. 2 a day for 4 days then 1 a day for four days  Dispense: 30 tablet; Refill: 0  2. Primary osteoarthritis of left knee Chronic.  Acute flare.  As noted above we  are going to initiate a prednisone taper and will refer to orthopedics for evaluation and treatment and possible injection. - predniSONE (DELTASONE) 10 MG tablet; Take 1 tablet (10 mg total) by mouth daily with breakfast. 2 a day for 4 days then 1 a day for four days  Dispense: 30 tablet; Refill: 0 - Ambulatory referral to Orthopedic Surgery

## 2019-06-12 NOTE — Patient Instructions (Signed)
What You Need to Know About Osteoarthritis Osteoarthritis is a type of arthritis that affects tissue that covers the ends of bones in joints (cartilage). Cartilage acts as a cushion between the bones and helps them move smoothly. Osteoarthritis results when cartilage in the joints gets worn down. Osteoarthritis is sometimes called "wear and tear" arthritis. Osteoarthritis can affect any joint and can make movement painful. Hips, knees, fingers, and toes are some of the joints that are most often affected by osteoarthritis. You may be more likely to develop osteoarthritis if:  You are middle-aged or older.  You are obese.  You have injured a joint or had surgery on a joint.  You have a family history of osteoarthritis. How can osteoarthritis affect me? Osteoarthritis can cause:  Pain and swelling in your joint.  Difficulty moving your joint.  A grating or scraping feeling inside the joint when you move it.  Popping or creaking sounds when you move. This condition can make it harder to do things that you need or want to do each day. Osteoarthritis in a major joint, such as your knee or hip, can make it painful to walk or exercise. If you have osteoarthritis in your hands, you might not be able to grip items, twist your hand, or control small movements of your hands and fingers (fine motor skills). Over time, osteoarthritis could cause you to be less physically active. Being less active increases your risk for other long-term (chronic) health problems, such as diabetes and heart disease. What lifestyle changes can be made? You can lessen the impact that osteoarthritis has on your daily life by:  Switching to low-impact activities that do not put repeated pressure on your joints. For example, if you usually run or jog for exercise, try swimming or riding a bike instead.  Staying active. Build up to at least 150 minutes of physical activity each week to keep your joints healthy and keep your  body strong.  Losing weight. If you are overweight or obese, losing weight can take pressure off of your joints. If you need help with weight loss, talk with your health care provider or a diet and nutrition specialist (dietitian). What other changes can be made? You can also lessen the effect of osteoarthritis by:  Using assistive devices. Sometimes a brace, wrap, splint, specialized glove, or cane can help. Talk with your health care provider or physical therapist about when and how to use these.  Working with a physical therapist who can help you find ways to do your daily activities without harming your joints. A physical therapist can also teach you exercises and stretches to strengthen the muscles that support your joints.  Treating pain and inflammation. Take over-the-counter and prescription medicines for pain and inflammation only as told by your health care provider. If directed, you may put ice on the affected joint: ? If you have a removable assistive device, remove it as told by your health care provider. ? Put ice in a plastic bag. ? Place a towel between your skin and the bag. ? Leave the ice on for 20 minutes, 2-3 times a day. If other measures do not work, you may need joint surgery, such as joint replacement. What can happen if changes are not made? Osteoarthritis is a condition that gets worse over time (a progressive condition). If you do not take steps to strengthen your body and to slow down the progress of the disease, your condition may get worse more quickly. Your joints may  stiffen and become swollen, which will make them painful and hard to move. Where to find more information You can learn more about osteoarthritis from:  The Arthritis Foundation: www.http://www.ingram.com/  [employer] of Arthritis and Musculoskeletal and Skin Diseases: www.niams.https://www.ruiz-smith.com/ Contact a health care  provider if:  You cannot do your normal activities comfortably.  Your joint does not function at all.  Your pain is interfering with your sleep.  You are gaining weight.  Your joint appears to be changing in shape, instead of just being swollen and sore. Summary  Osteoarthritis is a painful joint disease that gets worse over time.  This condition can lead to other long-term (chronic) health problems.  There are changes that you can make to slow down the progression of the disease. This information is not intended to replace advice given to you by your health care provider. Make sure you discuss any questions you have with your health care provider. Document Released: 03/13/2016 Document Revised: 07/05/2017 Document Reviewed: 03/13/2016 Elsevier Patient Education  2020 [employer].

## 2019-07-01 ENCOUNTER — Other Ambulatory Visit: Payer: Self-pay | Admitting: Family Medicine

## 2019-07-01 DIAGNOSIS — Z1231 Encounter for screening mammogram for malignant neoplasm of breast: Secondary | ICD-10-CM

## 2019-07-27 ENCOUNTER — Other Ambulatory Visit: Payer: Self-pay

## 2019-07-27 ENCOUNTER — Encounter: Payer: Self-pay | Admitting: Family Medicine

## 2019-07-27 ENCOUNTER — Ambulatory Visit (INDEPENDENT_AMBULATORY_CARE_PROVIDER_SITE_OTHER): Payer: BC Managed Care – PPO | Admitting: Family Medicine

## 2019-07-27 VITALS — BP 138/62 | HR 72 | Ht 64.0 in | Wt 166.0 lb

## 2019-07-27 DIAGNOSIS — K219 Gastro-esophageal reflux disease without esophagitis: Secondary | ICD-10-CM

## 2019-07-27 NOTE — Progress Notes (Signed)
Date:  07/27/2019   Name:  Rebecca Mosley   DOB:  Aug 25, 1955   MRN:  [id number]   Chief Complaint: Follow-up (for starting pantoprazole- no longer taking the med, the issue is resolved)  Gastroesophageal Reflux She reports no abdominal pain, no belching, no chest pain, no choking, no coughing, no dysphagia, no early satiety, no globus sensation, no heartburn, no hoarse voice, no nausea, no sore throat, no stridor, no tooth decay, no water brash or no wheezing. This is a chronic problem. The problem occurs occasionally. The problem has been waxing and waning. The symptoms are aggravated by certain foods. Pertinent negatives include no anemia, fatigue, melena, muscle weakness, orthopnea or weight loss. There are no known risk factors. She has tried a PPI (since discontinued) for the symptoms. The treatment provided moderate relief. Past procedures do not include an abdominal ultrasound, an EGD, esophageal manometry, esophageal pH monitoring, H. pylori antibody titer or a UGI.    No results found for: CREATININE, BUN, NA, K, CL, CO2 No results found for: CHOL, HDL, LDLCALC, LDLDIRECT, TRIG, CHOLHDL No results found for: TSH No results found for: HGBA1C   Review of Systems  Constitutional: Negative.  Negative for chills, fatigue, fever, unexpected weight change and weight loss.  HENT: Negative for congestion, ear discharge, ear pain, hoarse voice, rhinorrhea, sinus pressure, sneezing and sore throat.   Eyes: Negative for photophobia, pain, discharge, redness and itching.  Respiratory: Negative for cough, choking, shortness of breath, wheezing and stridor.   Cardiovascular: Negative for chest pain.  Gastrointestinal: Negative for abdominal pain, blood in stool, constipation, diarrhea, dysphagia, heartburn, melena, nausea and vomiting.  Endocrine: Negative for cold intolerance, heat intolerance, polydipsia, polyphagia and polyuria.  Genitourinary: Negative for dysuria, flank pain,  frequency, hematuria, menstrual problem, pelvic pain, urgency, vaginal bleeding and vaginal discharge.  Musculoskeletal: Negative for arthralgias, back pain, myalgias and muscle weakness.  Skin: Negative for rash.  Allergic/Immunologic: Negative for environmental allergies and food allergies.  Neurological: Negative for dizziness, weakness, light-headedness, numbness and headaches.  Hematological: Negative for adenopathy. Does not bruise/bleed easily.  Psychiatric/Behavioral: Negative for dysphoric mood. The patient is not nervous/anxious.     Patient Active Problem List   Diagnosis Date Noted  . Chronic venous insufficiency 04/04/2019  . Right upper quadrant pain 04/02/2019  . Gastroesophageal reflux disease 12/19/2018  . Irritable bowel syndrome 12/19/2018  . Lymphedema 10/05/2018  . Bilateral lower extremity edema 09/01/2018  . 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 Lower extremity pain, bilateral 09/01/2018    Allergies  Allergen Reactions  . Ibuprofen     Past Surgical History:  Procedure Laterality Date  . BARIATRIC SURGERY  2012    Social History   Tobacco Use  . Smoking status: Never Smoker  . Smokeless tobacco: Never Used  Substance Use Topics  . Alcohol use: No  . Drug use: No     Medication list has been reviewed and updated.  Current Meds  Medication Sig  . carbamide peroxide (DEBROX) 6.5 % OTIC solution Place 5 drops into the right ear 2 (two) times daily.  . Magnesium 500 MG CAPS Take 1 capsule by mouth daily.  . Multiple Vitamins-Minerals (PRESERVISION AREDS 2 PO) Take 1 tablet by mouth daily.  . [DISCONTINUED] Multiple Vitamin (MULTIVITAMIN) capsule Take 1 capsule by mouth daily.    PHQ 2/9 Scores 07/27/2019 12/11/2018 04/25/2017  PHQ - 2 Score 0 0 0  PHQ- 9 Score 0 0 0  BP Readings from Last 3 Encounters:  07/27/19 138/62  06/12/19 120/70  04/21/19 139/87    Physical Exam Vitals reviewed.  Constitutional:       Appearance: She is well-developed.  HENT:     Head: Normocephalic.     Right Ear: External ear normal.     Left Ear: External ear normal.  Eyes:     General: Lids are everted, no foreign bodies appreciated. No scleral icterus.       Left eye: No foreign body or hordeolum.     Conjunctiva/sclera: Conjunctivae normal.     Right eye: Right conjunctiva is not injected.     Left eye: Left conjunctiva is not injected.     Pupils: Pupils are equal, round, and reactive to light.  Neck:     Thyroid: No thyromegaly.     Vascular: No JVD.     Trachea: No tracheal deviation.  Cardiovascular:     Rate and Rhythm: Normal rate and regular rhythm.     Pulses: Normal pulses.     Heart sounds: Normal heart sounds. No murmur. No friction rub. No gallop.   Pulmonary:     Effort: Pulmonary effort is normal. No respiratory distress.     Breath sounds: Normal breath sounds. No wheezing or rales.  Abdominal:     General: Bowel sounds are normal.     Palpations: Abdomen is soft. There is no hepatomegaly, splenomegaly or mass.     Tenderness: There is no abdominal tenderness. There is no guarding or rebound.  Musculoskeletal:        General: No tenderness. Normal range of motion.     Cervical back: Normal range of motion and neck supple.  Lymphadenopathy:     Cervical: No cervical adenopathy.  Skin:    General: Skin is warm.     Findings: No rash.  Neurological:     Mental Status: She is alert and oriented to person, place, and time.     Cranial Nerves: No cranial nerve deficit.     Deep Tendon Reflexes: Reflexes normal.  Psychiatric:        Mood and Affect: Mood is not anxious or depressed.     Wt Readings from Last 3 Encounters:  07/27/19 166 lb (75.3 kg)  06/12/19 167 lb (75.8 kg)  04/21/19 163 lb (73.9 kg)    BP 138/62   Pulse 72   Ht 5\' 4"  (1.626 m)   Wt 166 lb (75.3 kg)   BMI 28.49 kg/m   Assessment and Plan: 1. Gastroesophageal reflux disease, unspecified whether esophagitis  present Chronic.  Episodic.  Stable.  Patient has had a history of gastric bypass surgery for which she has been informed that certain foods will cause issues with her stomach.  Patient has waxing and waning GERD which initially has been controlled on pantoprazole and has not needed any PPI involvement for the last several weeks.  Patient prefers not to be on medication we have suggested antacids on a as needed basis and if necessary we have discussed going on famotidine on a regular basis if necessary.

## 2019-08-06 ENCOUNTER — Ambulatory Visit
Admission: RE | Admit: 2019-08-06 | Discharge: 2019-08-06 | Disposition: A | Discharge status: Home / Self Care | Payer: BC Managed Care – PPO | Source: Ambulatory Visit | Attending: Family Medicine | Admitting: Family Medicine

## 2019-08-06 ENCOUNTER — Other Ambulatory Visit: Payer: Self-pay

## 2019-08-06 DIAGNOSIS — Z1231 Encounter for screening mammogram for malignant neoplasm of breast: Secondary | ICD-10-CM | POA: Diagnosis not present

## 2020-02-01 LAB — EXTERNAL GENERIC LAB PROCEDURE: COLOGUARD: NEGATIVE

## 2020-04-04 ENCOUNTER — Ambulatory Visit (INDEPENDENT_AMBULATORY_CARE_PROVIDER_SITE_OTHER): Payer: BLUE CROSS/BLUE SHIELD | Admitting: Vascular Surgery

## 2020-08-07 ENCOUNTER — Other Ambulatory Visit: Payer: Self-pay

## 2020-08-07 ENCOUNTER — Ambulatory Visit
Admission: EM | Admit: 2020-08-07 | Discharge: 2020-08-07 | Disposition: A | Discharge status: Home / Self Care | Payer: 59 | Attending: Emergency Medicine | Admitting: Emergency Medicine

## 2020-08-07 DIAGNOSIS — Z20822 Contact with and (suspected) exposure to covid-19: Secondary | ICD-10-CM | POA: Insufficient documentation

## 2020-08-07 DIAGNOSIS — J029 Acute pharyngitis, unspecified: Secondary | ICD-10-CM

## 2020-08-07 LAB — GROUP A STREP BY PCR: Group A Strep by PCR: NOT DETECTED

## 2020-08-07 NOTE — ED Provider Notes (Addendum)
[facility] - [place], [place]   Name: Rebecca Mosley DOB: 1955-12-14 MRN: [id number] CSN: [id number] PCP: Duanne Limerick, MD  Arrival date and time:  08/07/20 7425  Chief Complaint:  Sore Throat (/)   NOTE: Prior to seeing the patient today, I have reviewed the triage nursing documentation and vital signs. Clinical staff has updated patient's PMH/PSHx, current medication list, and drug allergies/intolerances to ensure comprehensive history available to assist in medical decision making.   History:   HPI: Rebecca Mosley is a 65 y.o. female who presents today with complaints of the symptoms below.   COVID-19 assessment Symptoms: Sinus drainage and sore throat Symptom onset: 12/29 Vaccination status: Fully vaccinated Positive exposure: No known positive exposure Social precautions: Wears a mask in social settings Employment risk: None Previous COVID-19 test result: No known positive test  History reviewed. No pertinent past medical history.  Past Surgical History:  Procedure Laterality Date  . BARIATRIC SURGERY  2012    Family History  Problem Relation Age of Onset  . Cancer Mother   . Diabetes Father   . Diabetes Sister   . Breast cancer Cousin        mat and pat cousins    Social History   Tobacco Use  . Smoking status: Never Smoker  . Smokeless tobacco: Never Used  Substance Use Topics  . Alcohol use: No  . Drug use: No    Patient Active Problem List   Diagnosis Date Noted  . Chronic venous insufficiency 04/04/2019  . Right upper quadrant pain 04/02/2019  . Gastroesophageal reflux disease 12/19/2018  . Irritable bowel syndrome 12/19/2018  . Lymphedema 10/05/2018  . Bilateral lower extremity edema 09/01/2018  . 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 Lower extremity pain, bilateral 09/01/2018    Home Medications:    No outpatient medications have been marked as taking for the 08/07/20 encounter [facility]  Encounter).    Allergies:   Ibuprofen  Review of Systems (ROS): Review of Systems   Vital Signs: Today's Vitals   08/07/20 0846 08/07/20 0853 08/07/20 0923  BP:  133/77   Pulse:  82   Resp:  16   TempSrc:  Oral   SpO2:  98%   Weight: 166 lb (75.3 kg)    PainSc: 0-No pain  0-No pain    Physical Exam: Physical Exam   Urgent Care Treatments / Results:   LABS: PLEASE NOTE: all labs that were ordered this encounter are listed, however only abnormal results are displayed. Labs Reviewed  GROUP A STREP BY PCR  SARS CORONAVIRUS 2 (TAT 6-24 HRS)    EKG: -None  RADIOLOGY: No results found.  PROCEDURES: Procedures  MEDICATIONS RECEIVED THIS VISIT: Medications - No data to display  PERTINENT CLINICAL COURSE NOTES/UPDATES:   Initial Impression / Assessment and Plan / Urgent Care Course:  Pertinent labs & imaging results that were available during my care of the patient were personally reviewed by me and considered in my medical decision making (see lab/imaging section of note for values and interpretations).  Rebecca Mosley is a 65 y.o. female who presents to [facility] today with complaints of sore throat, diagnosed with pharyngitis, and treated as such with the directions below in the setting of a negative strep test. NP and patient reviewed discharge instructions below during visit.   Patient is well appearing overall in clinic today. She does not appear to be in any acute distress. Presenting  symptoms (see HPI) and exam as documented above.   I have reviewed the follow up and strict return precautions for any new or worsening symptoms. Patient is aware of symptoms that would be deemed urgent/emergent, and would thus require further evaluation either here or in the emergency department. At the time of discharge, she verbalized understanding and consent with the discharge plan as it was reviewed with her. All questions were fielded by provider and/or clinic staff  prior to patient discharge.    Final Clinical Impressions / Urgent Care Diagnoses:   Final diagnoses:  Pharyngitis, unspecified etiology    New Prescriptions:  [employer] Controlled Substance Registry consulted? Not Applicable  No orders of the defined types were placed in this encounter.     Discharge Instructions     You were seen for sore throat and are being treated for the same.   You were tested for COVID-19. If you know you were exposed to a confirmed case of COVID-19, continue quarantine until your test results are available.  Interact as little as possible until COVID-19 test results are available.  Continue to keep her social distance of 6 feet from others, wash hands frequently and wear facemask when indoors or when you are unable to social distance in outdoor settings.  If you develop any symptoms, reach out to the urgent care for further instructions.  If your test results are positive, a member of the urgent care team will reach out to you with further instructions. If you have any further questions, please don't hesitate to reach out to the urgent care clinic.  Please sign up for MyChart to access your lab results.  Over the counter medications that will help your symptoms: Flonase (runny nose, congestion), Zyrtec or Xyzal (postnasal drip, sneezing), Tylenol (fever and body aches).  Take care, Dr. Sharlet Salina, NP-c      Recommended Follow up Care:  Patient encouraged to follow up with the following provider within the specified time frame, or sooner as dictated by the severity of her symptoms. As always, she was instructed that for any urgent/emergent care needs, she should seek care either here or in the emergency department for more immediate evaluation.   Bailey Mech, DNP, NP-c    Bailey Mech, NP 08/07/20 1831    Bailey Mech, NP 08/07/20 8938

## 2020-08-07 NOTE — Discharge Instructions (Addendum)
You were seen for sore throat and are being treated for the same.   You were tested for COVID-19. If you know you were exposed to a confirmed case of COVID-19, continue quarantine until your test results are available.  Interact as little as possible until COVID-19 test results are available.  Continue to keep her social distance of 6 feet from others, wash hands frequently and wear facemask when indoors or when you are unable to social distance in outdoor settings.  If you develop any symptoms, reach out to the urgent care for further instructions.  If your test results are positive, a member of the urgent care team will reach out to you with further instructions. If you have any further questions, please don't hesitate to reach out to the urgent care clinic.  Please sign up for MyChart to access your lab results.  Over the counter medications that will help your symptoms: Flonase (runny nose, congestion), Zyrtec or Xyzal (postnasal drip, sneezing), Tylenol (fever and body aches).  Take care, Dr. Sharlet Salina, NP-c

## 2020-08-07 NOTE — ED Triage Notes (Signed)
Patient presents to Urgent Care with complaints of sinus drainage and sore throat since Thursday. Patient reports treating at home with alkatzer. Denies fever, N/V, or diarrhea.

## 2020-08-08 LAB — SARS CORONAVIRUS 2 (TAT 6-24 HRS): SARS Coronavirus 2: NEGATIVE

## 2020-09-01 IMAGING — MG DIGITAL SCREENING BILATERAL MAMMOGRAM WITH TOMO AND CAD
8 series · 8 of 24 positions shown · non-contrast
Comparison: Previous exam(s).

CLINICAL DATA: Screening.

EXAM:
DIGITAL SCREENING BILATERAL MAMMOGRAM WITH TOMO AND CAD

[L CC synth-2D]
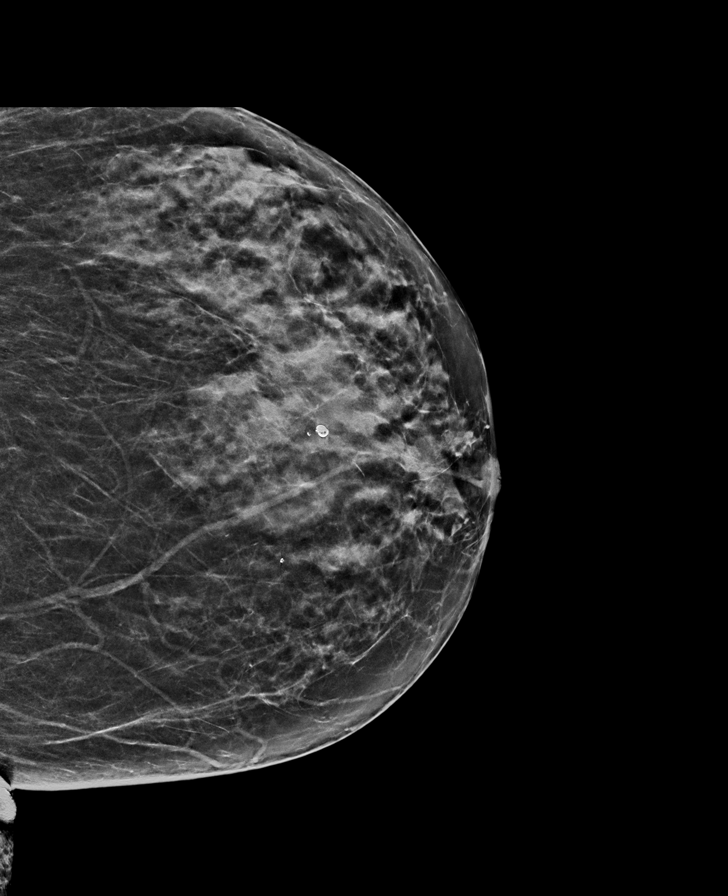

[L MLO synth-2D]
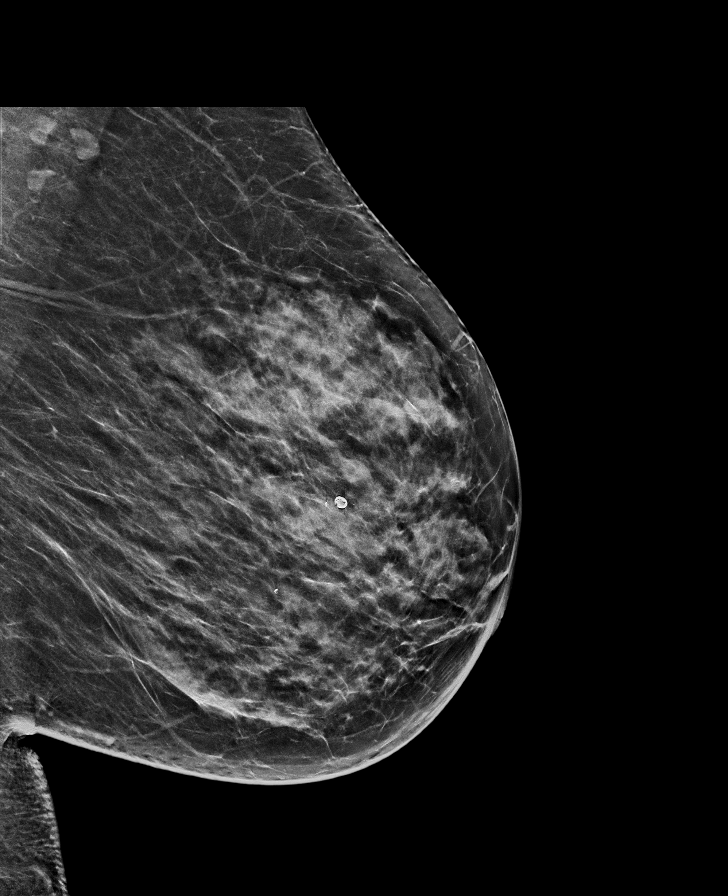

[R MLO synth-2D]
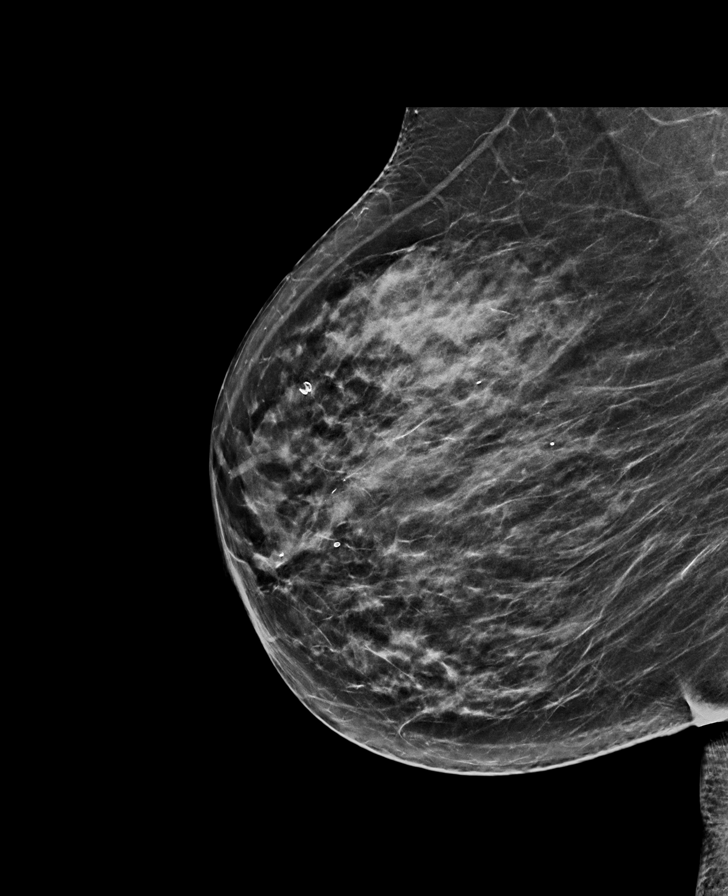

[R CC synth-2D]
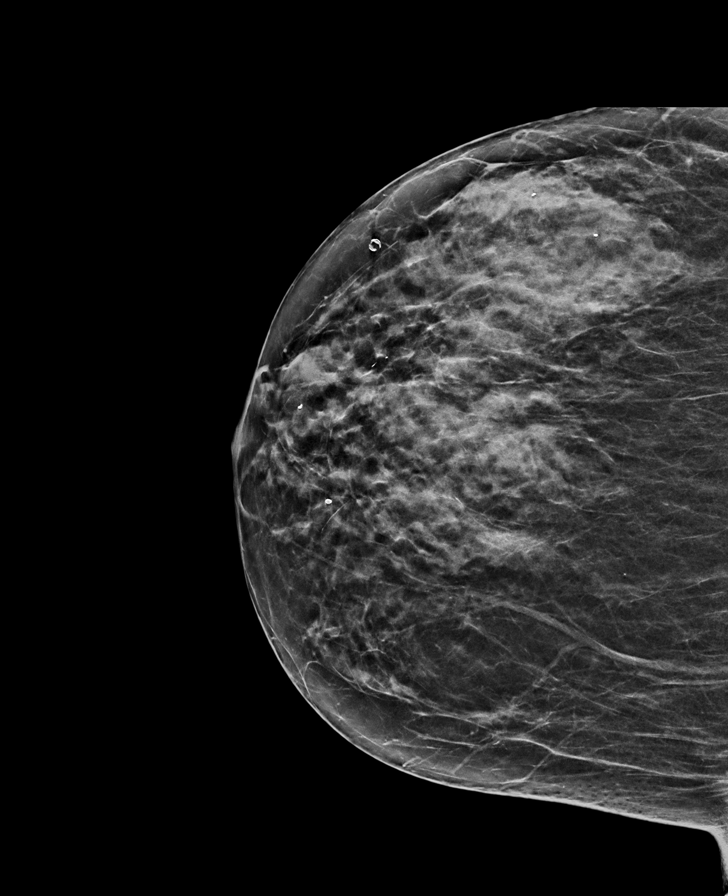

[L CC tomo · tomo slice 31/62.0]
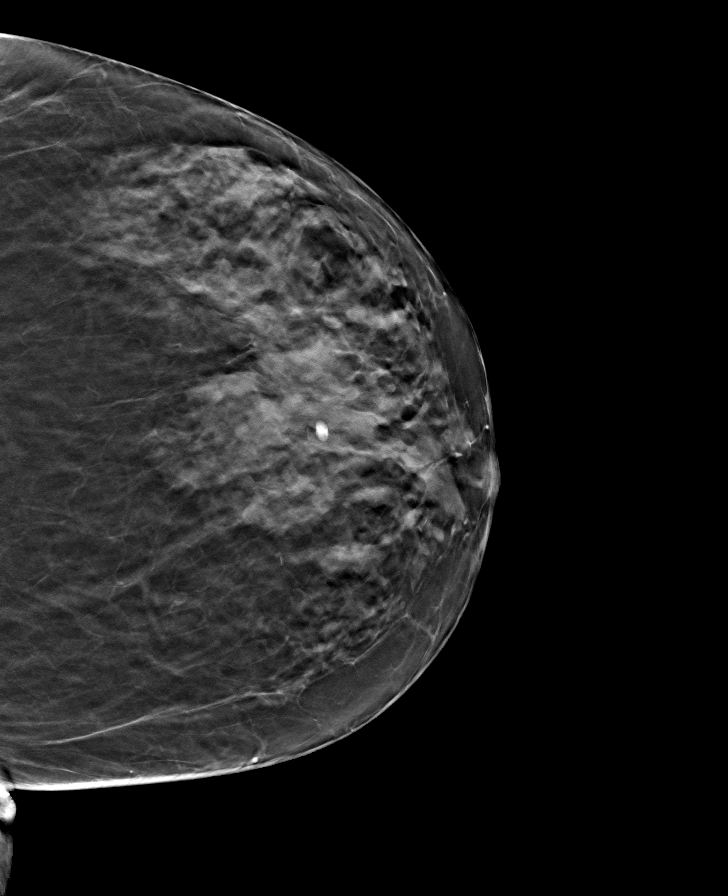

[R MLO tomo · tomo slice 33/66.0]
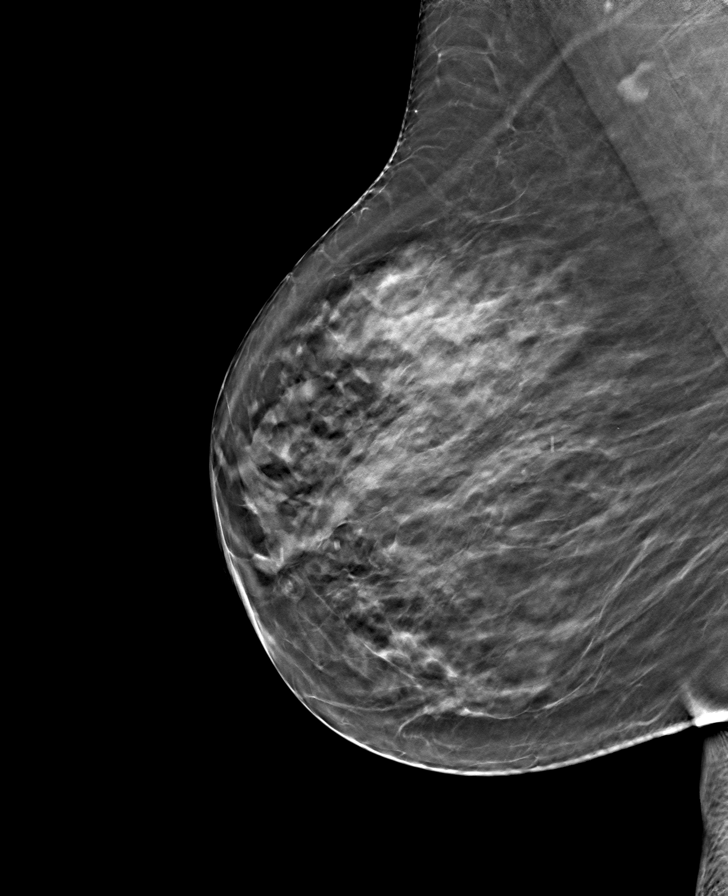

[L MLO tomo · tomo slice 34/67.0]
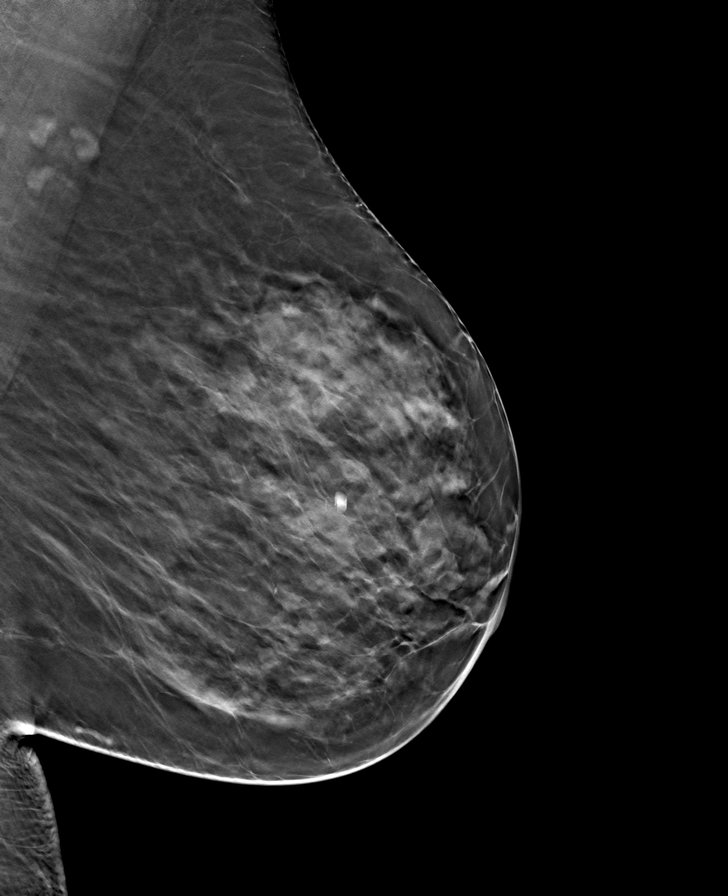

[R CC tomo · tomo slice 31/60.0]
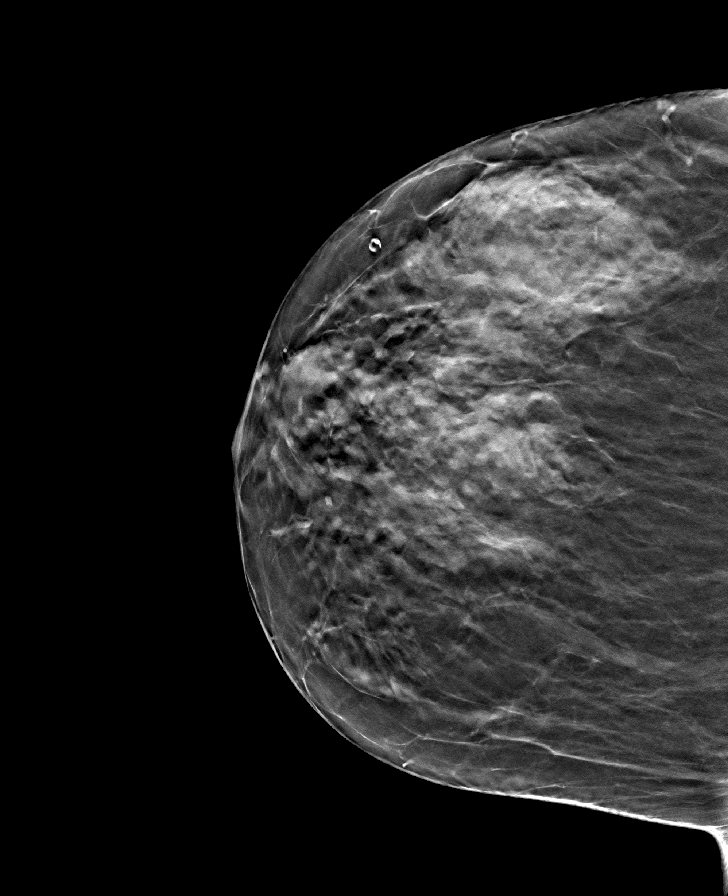

[8 of 24 positions shown; findings below may reference images not displayed]

ACR Breast Density Category c: The breast tissue is heterogeneously
dense, which may obscure small masses.
FINDINGS: There are no findings suspicious for malignancy. Images were
processed with CAD.
IMPRESSION: No mammographic evidence of malignancy. A result letter of this
screening mammogram will be mailed directly to the patient.

RECOMMENDATION:
Screening mammogram in one year. (Code:[id number]-[id number])

BI-RADS CATEGORY  1: Negative.

## 2021-04-29 ENCOUNTER — Other Ambulatory Visit: Payer: Self-pay

## 2021-04-29 ENCOUNTER — Ambulatory Visit
Admission: EM | Admit: 2021-04-29 | Discharge: 2021-04-29 | Disposition: A | Discharge status: Other Acute Inpatient Hospital | Payer: 59 | Attending: Emergency Medicine | Admitting: Emergency Medicine

## 2021-04-29 DIAGNOSIS — R1011 Right upper quadrant pain: Secondary | ICD-10-CM | POA: Diagnosis present

## 2021-04-29 LAB — POCT URINALYSIS DIP (DEVICE)
Bilirubin Urine: NEGATIVE
Glucose, UA: NEGATIVE mg/dL
Hgb urine dipstick: NEGATIVE
Ketones, ur: NEGATIVE mg/dL
Nitrite: NEGATIVE
Protein, ur: NEGATIVE mg/dL
Specific Gravity, Urine: >=1.03 (ref 1.005–1.030)
Urobilinogen, UA: 0.2 mg/dL (ref 0.0–1.0)
pH: 5.5 (ref 5.0–8.0)

## 2021-04-29 NOTE — ED Triage Notes (Addendum)
Pt c/o right-sided abd pain, has had occasionally before but never this bad. Pt denies f/n/v/d, urinary issues or other symptoms.

## 2021-04-29 NOTE — ED Notes (Signed)
Patient is being discharged from the Urgent Care and sent to the [facility] Emergency Department via private vehicle . Per private vehicle, patient is in need of higher level of care due to needing possible imaging and blood work. Patient is aware and verbalizes understanding of plan of care.  Vitals:   04/29/21 1411  BP: 129/83  Pulse: 83  Resp: 18  Temp: 98.8 F (37.1 C)  SpO2: 94%

## 2021-04-29 NOTE — ED Provider Notes (Signed)
HPI  SUBJECTIVE:  Rebecca Mosley is a 65 y.o. female who presents with right upper quadrant abdominal pain and tenderness starting this morning.  She states it is constant, alternates between dull and sharp.  Started in the right flank but it has now localized to the right upper quadrant.  She denies nausea, vomiting, fevers, anorexia, urinary or vaginal complaints.  No back pain.  No trauma to the abdomen.  Had a normal bowel movement yesterday.  No abdominal distention.  She states that the car ride over here was not painful.  No antipyretic in the past 6 hours.  She has not taken anything for this.  No alleviating factors.  There are no aggravating factors-it is not associated with walking, eating, urination.  Car ride over here was not painful.  She is status post gastric bypass.  No history of gallbladder disease, pancreatitis, UTI, nephrolithiasis, diabetes, hypertension.   History reviewed. No pertinent past medical history.  Past Surgical History:  Procedure Laterality Date   BARIATRIC SURGERY  2012    Family History  Problem Relation Age of Onset   Cancer Mother    Diabetes Father    Diabetes Sister    Breast cancer Cousin        mat and pat cousins    Social History   Tobacco Use   Smoking status: Never   Smokeless tobacco: Never  Substance Use Topics   Alcohol use: No   Drug use: No    No current facility-administered medications for this encounter.  Current Outpatient Medications:    B Complex-C-Folic Acid (B COMPLEX-VITAMIN C-FOLIC ACID) 1 MG tablet, Take by mouth., Disp: , Rfl:    calcium carbonate (OSCAL) 1500 (600 Ca) MG TABS tablet, Take by mouth 2 (two) times daily with a meal., Disp: , Rfl:    carbamide peroxide (DEBROX) 6.5 % OTIC solution, Place 5 drops into the right ear 2 (two) times daily., Disp: 15 mL, Rfl: 0   ferrous sulfate 325 (65 FE) MG tablet, Take 325 mg by mouth daily with breakfast., Disp: , Rfl:    gabapentin (NEURONTIN) 100 MG capsule,  Take by mouth., Disp: , Rfl:    LINZESS 145 MCG CAPS capsule, Take 145 mcg by mouth daily., Disp: , Rfl:    Magnesium 500 MG CAPS, Take 1 capsule by mouth daily., Disp: , Rfl:    Multiple Vitamins-Minerals (PRESERVISION AREDS 2 PO), Take 1 tablet by mouth daily., Disp: , Rfl:   Allergies  Allergen Reactions   Ibuprofen Hives, Itching and Palpitations     ROS  As noted in HPI.   Physical Exam  BP 129/83 (BP Location: Left Arm)   Pulse 83   Temp 98.8 F (37.1 C) (Oral)   Resp 18   Ht 5\' 3"  (1.6 m)   Wt 80.7 kg   SpO2 94%   BMI 31.53 kg/m   Constitutional: Well developed, well nourished, no acute distress Eyes:  EOMI, conjunctiva normal bilaterally HENT: Normocephalic, atraumatic,mucus membranes moist Respiratory: Normal inspiratory effort Cardiovascular: Normal rate GI: nondistended.  Positive laparoscopic surgical scars.  Negative McBurney.  Right upper quadrant tenderness with positive Murphy.  Soft, no rebound, guarding. Back: No CVAT skin: No rash, skin intact Musculoskeletal: no deformities Neurologic: Alert & oriented x 3, no focal neuro deficits Psychiatric: Speech and behavior appropriate   ED Course   Medications - No data to display  Orders Placed This Encounter  Procedures   After Hours POC Urinalysis Dipstick  Standing Status:   Standing    Number of Occurrences:   1   POCT urinalysis dip (device)    Standing Status:   Standing    Number of Occurrences:   1    Results for orders placed or performed during the hospital encounter of 04/29/21 (from the past 24 hour(s))  POCT urinalysis dip (device)     Status: Abnormal   Collection Time: 04/29/21  2:16 PM  Result Value Ref Range   Glucose, UA NEGATIVE NEGATIVE mg/dL   Bilirubin Urine NEGATIVE NEGATIVE   Ketones, ur NEGATIVE NEGATIVE mg/dL   Specific Gravity, Urine >=1.030 1.005 - 1.030   Hgb urine dipstick NEGATIVE NEGATIVE   pH 5.5 5.0 - 8.0   Protein, ur NEGATIVE NEGATIVE mg/dL    Urobilinogen, UA 0.2 0.0 - 1.0 mg/dL   Nitrite NEGATIVE NEGATIVE   Leukocytes,Ua TRACE (A) NEGATIVE   No results found.  ED Clinical Impression  1. Right upper quadrant abdominal pain      ED Assessment/Plan  Patient has right upper quadrant tenderness and a positive Murphy.  Concern for cholecystitis.  While she has trace leukocytes in her urine, she has no urinary complaints.  She has no flank or suprapubic, CVA tenderness, doubt UTI/pyelonephritis.  Discussed with her that we do not have imaging or labs here today in order to evaluate for cholecystitis and have advised her to go to the nearest ER.  She is going to go to [facility].  She is stable to go by private vehicle.  Advised her to not have anything to eat or drink until her ER evaluation is complete.  She understands and states that she will go immediately to the ED.  Patient was not given Toradol because she has ibuprofen allergy, and has never had Toradol before.  No orders of the defined types were placed in this encounter.     *This clinic note was created using Dragon dictation software. Therefore, there may be occasional mistakes despite careful proofreading.  ?    Domenick Gong, MD 04/29/21 1530

## 2021-04-29 NOTE — Discharge Instructions (Addendum)
I am concerned that this is your gallbladder.  Do not have anything to eat or drink until your ER evaluation is complete.  Go immediately to the emergency department.  If it is cholecystitis, you could end up having emergency surgery.

## 2021-09-03 IMAGING — MG DIGITAL SCREENING BILAT W/ TOMO W/ CAD
8 series · 8 of 24 positions shown · non-contrast
Comparison: Previous exam(s).

CLINICAL DATA: Screening.

EXAM:
DIGITAL SCREENING BILATERAL MAMMOGRAM WITH TOMO AND CAD

[L MLO synth-2D]
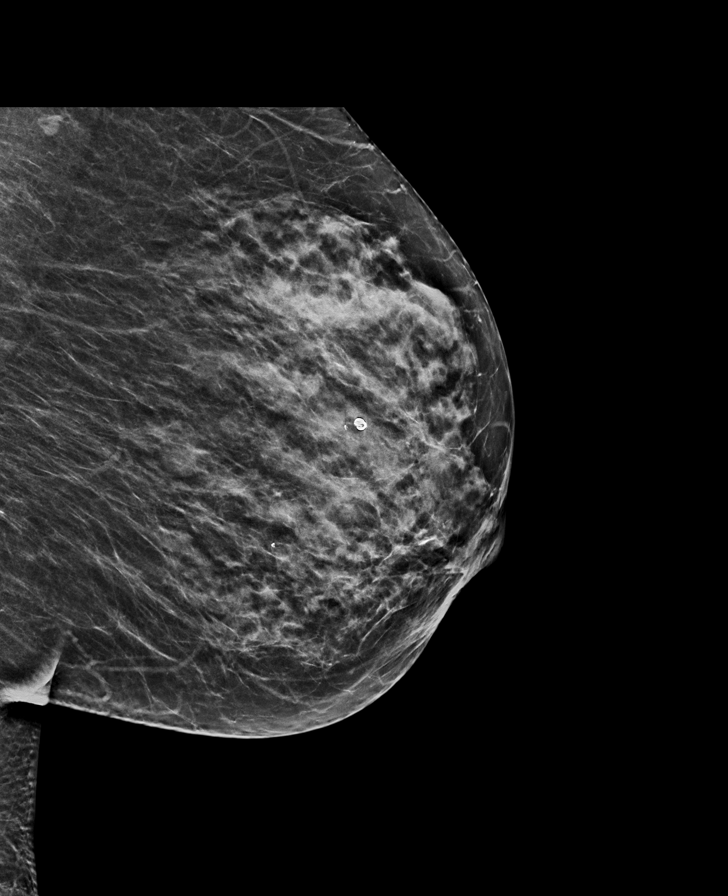

[R CC synth-2D]
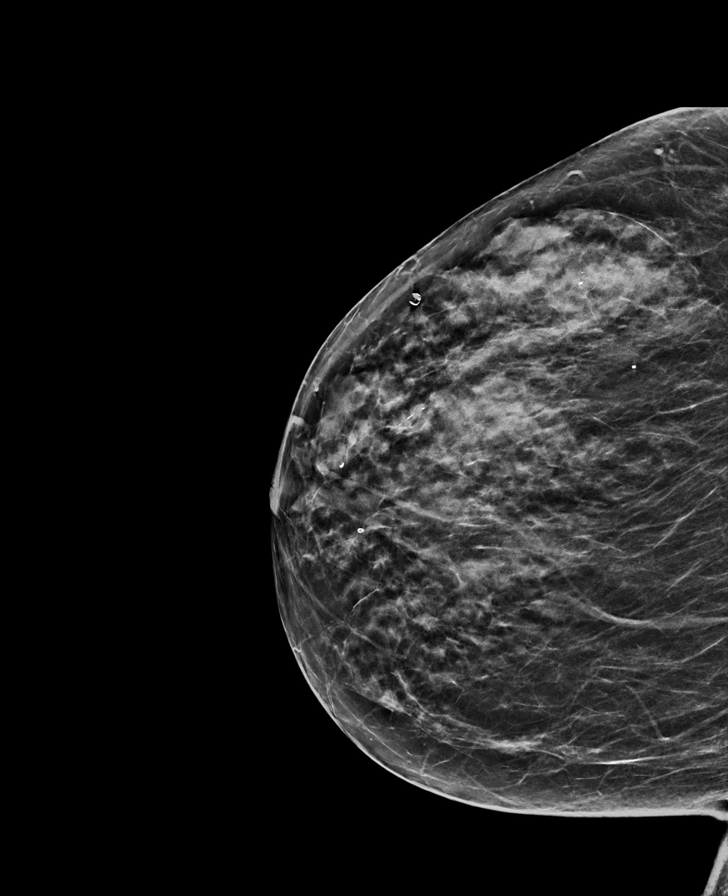

[L CC synth-2D]
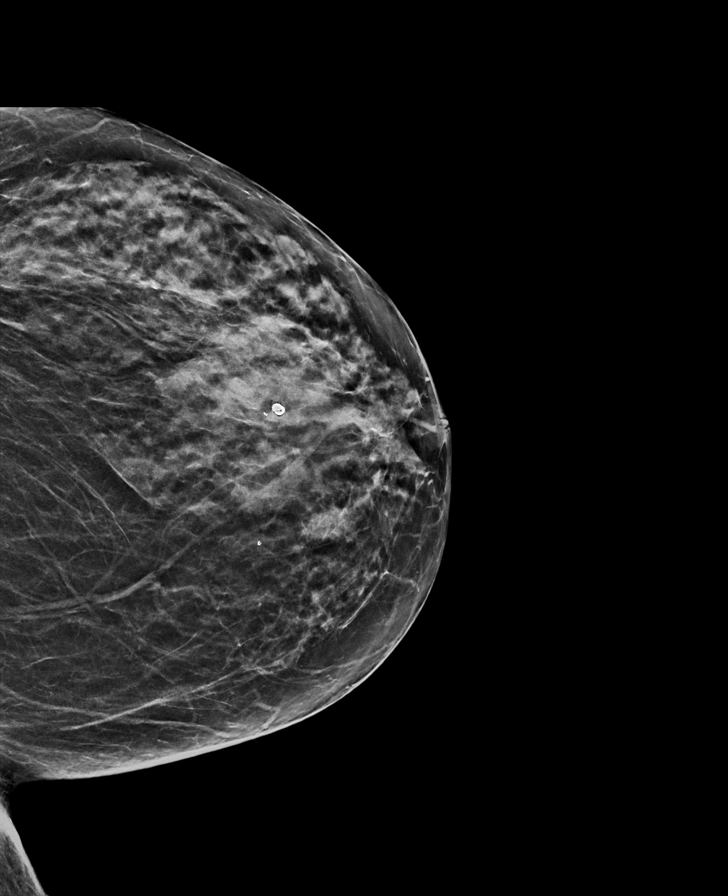

[R MLO synth-2D]
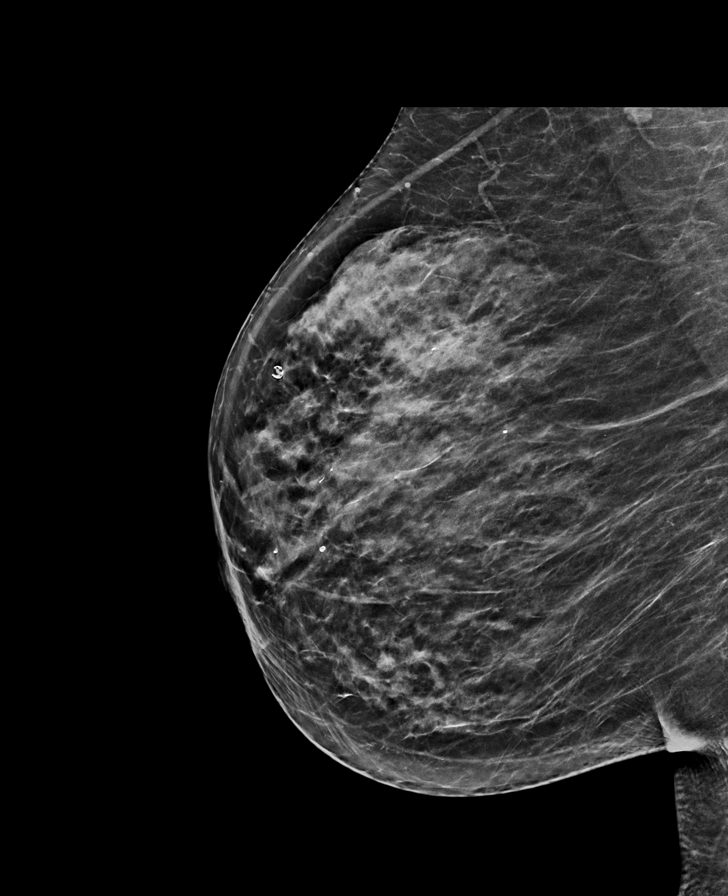

[R MLO tomo · tomo slice 31/62.0]
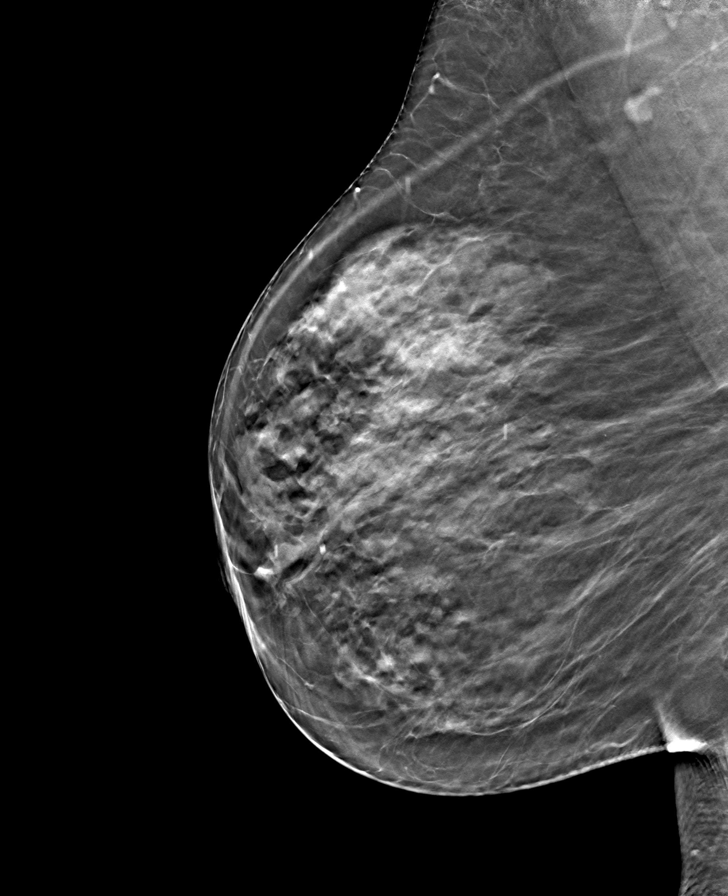

[R CC tomo · tomo slice 29/58.0]
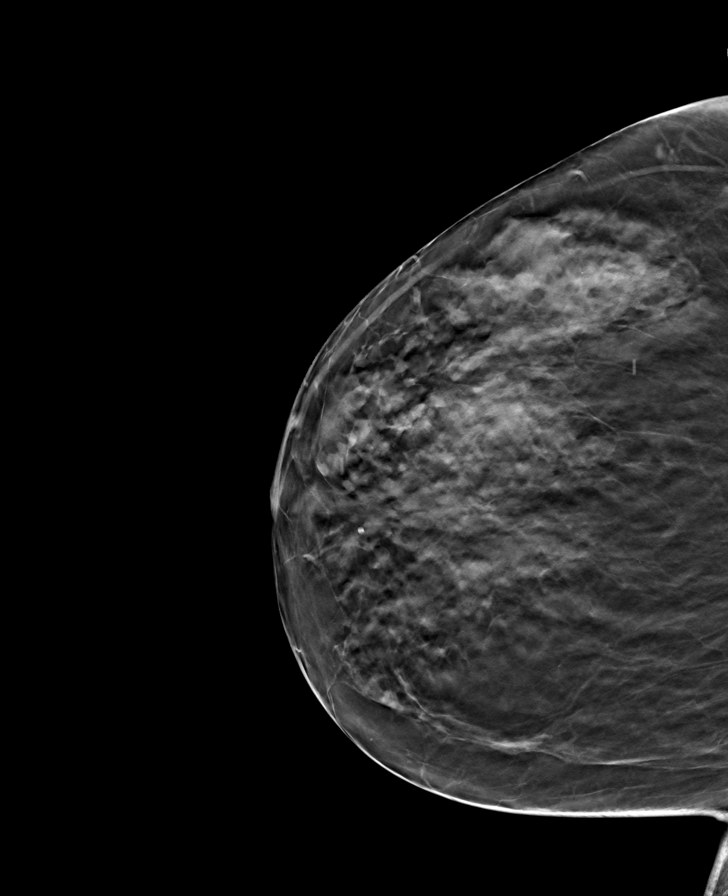

[L CC tomo · tomo slice 30/59.0]
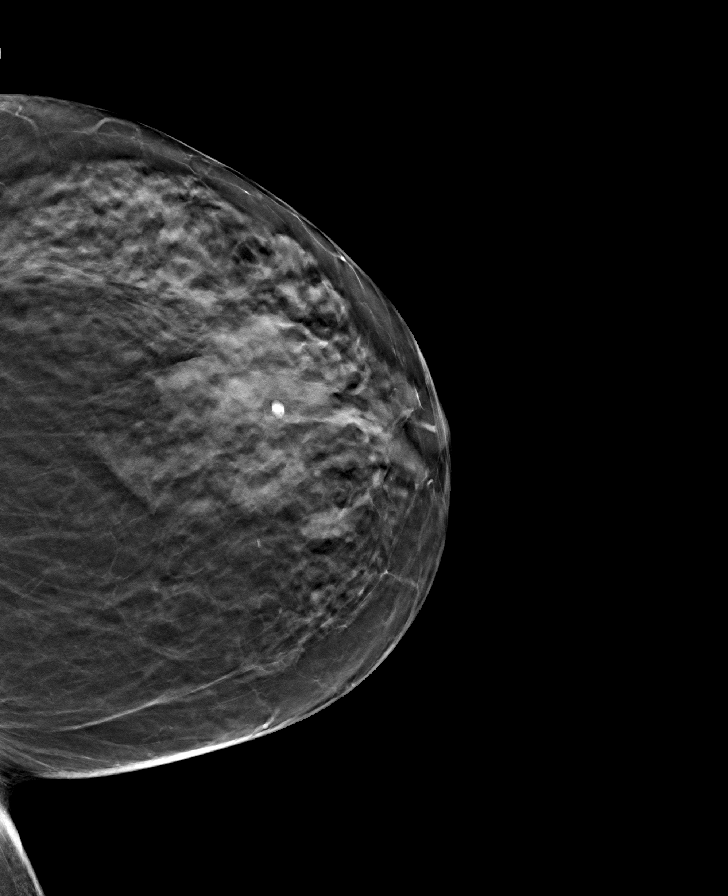

[L MLO tomo · tomo slice 31/60.0]
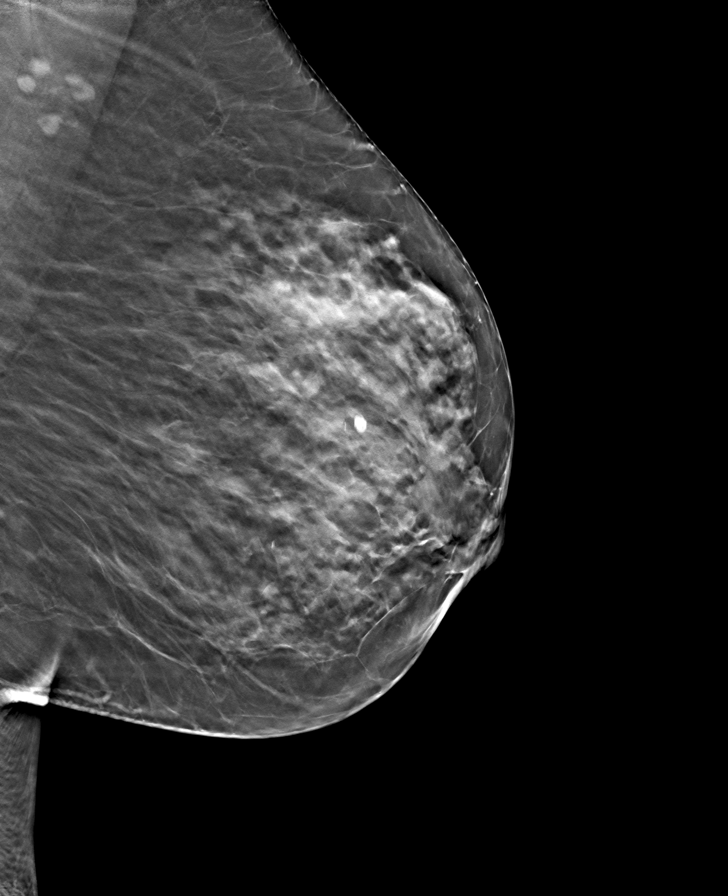

[8 of 24 positions shown; findings below may reference images not displayed]

ACR Breast Density Category c: The breast tissue is heterogeneously
dense, which may obscure small masses.
FINDINGS: There are no findings suspicious for malignancy. Images were
processed with CAD.
IMPRESSION: No mammographic evidence of malignancy. A result letter of this
screening mammogram will be mailed directly to the patient.

RECOMMENDATION:
Screening mammogram in one year. (Code:[id number]-U-LHB)

BI-RADS CATEGORY  1: Negative.

## 2023-03-13 LAB — EXTERNAL GENERIC LAB PROCEDURE: COLOGUARD: NEGATIVE

## 2023-11-03 ENCOUNTER — Encounter: Payer: Self-pay | Admitting: Emergency Medicine

## 2023-11-03 ENCOUNTER — Ambulatory Visit
Admission: EM | Admit: 2023-11-03 | Discharge: 2023-11-03 | Disposition: A | Discharge status: Home / Self Care | Attending: Emergency Medicine | Admitting: Emergency Medicine

## 2023-11-03 ENCOUNTER — Ambulatory Visit (INDEPENDENT_AMBULATORY_CARE_PROVIDER_SITE_OTHER)

## 2023-11-03 DIAGNOSIS — S63502A Unspecified sprain of left wrist, initial encounter: Secondary | ICD-10-CM

## 2023-11-03 NOTE — Discharge Instructions (Signed)
 Your x-ray was negative for fracture as read by me and radiology.  Wear the wrist splint at all times for the next week or so and then as needed for comfort.  May take 1000 mg Tylenol 3-4 times a day as needed for pain.  Continue muscle rub.  Ice for 20 minutes at a time.  Please follow-up with [facility] if not better in a week to 10 days.

## 2023-11-03 NOTE — ED Provider Notes (Signed)
 HPI  SUBJECTIVE:  Rebecca Mosley is a right-handed 68 y.o. female who presents with sharp, constant radial left wrist pain after having a trip and fall yesterday.  States that she fell onto her right hand, but injured her left wrist.  She is not sure exactly how she fell onto her wrist.  Reports limited motion of the wrist secondary to pain.  No bruising, swelling.    History reviewed. No pertinent past medical history.  Past Surgical History:  Procedure Laterality Date   BARIATRIC SURGERY  2012    Family History  Problem Relation Age of Onset   Cancer Mother    Diabetes Father    Diabetes Sister    Breast cancer Cousin        mat and pat cousins    Social History   Tobacco Use   Smoking status: Never   Smokeless tobacco: Never  Vaping Use   Vaping status: Never Used  Substance Use Topics   Alcohol use: No   Drug use: No    No current facility-administered medications for this encounter.  Current Outpatient Medications:    B Complex-C-Folic Acid (B COMPLEX-VITAMIN C-FOLIC ACID) 1 MG tablet, Take by mouth., Disp: , Rfl:    calcium carbonate (OSCAL) 1500 (600 Ca) MG TABS tablet, Take by mouth 2 (two) times daily with a meal., Disp: , Rfl:    carbamide peroxide (DEBROX) 6.5 % OTIC solution, Place 5 drops into the right ear 2 (two) times daily., Disp: 15 mL, Rfl: 0   ferrous sulfate 325 (65 FE) MG tablet, Take 325 mg by mouth daily with breakfast., Disp: , Rfl:    LINZESS 145 MCG CAPS capsule, Take 145 mcg by mouth daily., Disp: , Rfl:    Magnesium 500 MG CAPS, Take 1 capsule by mouth daily., Disp: , Rfl:    Multiple Vitamins-Minerals (PRESERVISION AREDS 2 PO), Take 1 tablet by mouth daily., Disp: , Rfl:   Allergies  Allergen Reactions   Ibuprofen Hives, Itching and Palpitations     ROS  As noted in HPI.   Physical Exam  BP (!) 145/87 (BP Location: Right Arm)   Pulse 75   Temp 98.1 F (36.7 C) (Oral)   Resp 15   Ht 5\' 3"  (1.6 m)   Wt 80.7 kg   SpO2 96%    BMI 31.52 kg/m   Constitutional: Well developed, well nourished, no acute distress Eyes:  EOMI, conjunctiva normal bilaterally HENT: Normocephalic, atraumatic,mucus membranes moist Respiratory: Normal inspiratory effort Cardiovascular: Normal rate GI: nondistended skin: No rash, skin intact Musculoskeletal: L  distal radius tender on the volar aspect, distal ulnar styloid NT , snuffbox NT, carpals NT, metacarpals NT, digits NT , TFCC NT.   no pain with supination, no pain with pronation, pain with radial deviation, no pain with ulnar deviation.  no pain with flexion/extension.  Motor intact in the median/radial/ulnar distribution, Sensation LT to hand normal . Rp 2+.  No bruising, erythema, edema.  Tinel pos / neg.Marland Kitchen Elbow and proximal forearm NT.  Neurologic: Alert & oriented x 3, no focal neuro deficits Psychiatric: Speech and behavior appropriate   ED Course   Medications - No data to display  Orders Placed This Encounter  Procedures   DG Wrist Complete Left    Standing Status:   Standing    Number of Occurrences:   1    Reason for Exam (SYMPTOM  OR DIAGNOSIS REQUIRED):   left wrist pain due to fall yesterday  Apply Wrist brace    Standing Status:   Standing    Number of Occurrences:   1    Laterality:   Left    No results found for this or any previous visit (from the past 24 hours). DG Wrist Complete Left Result Date: 11/03/2023 CLINICAL DATA:  Left wrist pain due to fall yesterday EXAM: LEFT WRIST - COMPLETE 3 VIEW COMPARISON:  None Available. FINDINGS: There is no evidence of fracture or dislocation. There is no evidence of arthropathy or other focal bone abnormality. Soft tissues are unremarkable. Subjective osteopenia. Minor degenerative spurring at the STT joint. IMPRESSION: Negative. Electronically Signed   By: Tiburcio Pea M.D.   On: 11/03/2023 08:53    ED Clinical Impression  1. Sprain of left wrist, initial encounter      ED Assessment/Plan   {The  patient has been seen in Urgent Care in the last 3 years. :1}   Reviewed imaging independently.  Osteopenia.  No fracture or dislocation.  See radiology report for full details.  Patient presents with a left wrist sprain.  X-ray negative today.  Will place in a wrist splint.  Tylenol 1000 mg 3 times daily, she is allergic to ibuprofen.  Muscle rub.  Ice.  Follow-up with [facility] in a week to 10 days if not better with conservative therapy.  Discussed  imaging, MDM, treatment plan, and plan for follow-up with patient.  patient agrees with plan.   No orders of the defined types were placed in this encounter.     *This clinic note was created using Dragon dictation software. Therefore, there may be occasional mistakes despite careful proofreading.  ?

## 2023-11-03 NOTE — ED Triage Notes (Signed)
 Patient states that she tripped and fell yesterday.  Patient c/o left wrist pain.
